# Patient Record
Sex: Female | Born: 1980 | Race: White | Hispanic: No | Marital: Married | Smoking: Former smoker
Health system: Southern US, Community
[De-identification: ages and names within clinical notes are randomized; demographics above are authoritative.]

## PROBLEM LIST (undated history)

## (undated) DIAGNOSIS — R519 Headache, unspecified: Secondary | ICD-10-CM

## (undated) DIAGNOSIS — I514 Myocarditis, unspecified: Secondary | ICD-10-CM

## (undated) DIAGNOSIS — J439 Emphysema, unspecified: Secondary | ICD-10-CM

## (undated) DIAGNOSIS — M545 Low back pain: Secondary | ICD-10-CM

## (undated) DIAGNOSIS — G5793 Unspecified mononeuropathy of bilateral lower limbs: Secondary | ICD-10-CM

## (undated) DIAGNOSIS — D649 Anemia, unspecified: Secondary | ICD-10-CM

## (undated) DIAGNOSIS — R51 Headache: Secondary | ICD-10-CM

## (undated) DIAGNOSIS — M3500 Sicca syndrome, unspecified: Secondary | ICD-10-CM

## (undated) DIAGNOSIS — M1990 Unspecified osteoarthritis, unspecified site: Secondary | ICD-10-CM

## (undated) HISTORY — PX: ABDOMINAL HYSTERECTOMY: SHX81

## (undated) HISTORY — PX: TONSILLECTOMY: SUR1361

---

## 2004-06-16 ENCOUNTER — Emergency Department: Payer: Self-pay | Admitting: Emergency Medicine

## 2006-04-23 ENCOUNTER — Emergency Department: Payer: Self-pay | Admitting: Emergency Medicine

## 2006-08-19 ENCOUNTER — Emergency Department: Payer: Self-pay | Admitting: Emergency Medicine

## 2006-12-29 ENCOUNTER — Observation Stay: Payer: Self-pay

## 2007-01-05 ENCOUNTER — Observation Stay: Payer: Self-pay | Admitting: Unknown Physician Specialty

## 2007-01-06 ENCOUNTER — Encounter: Payer: Self-pay | Admitting: Obstetrics and Gynecology

## 2007-01-20 ENCOUNTER — Encounter: Payer: Self-pay | Admitting: Maternal & Fetal Medicine

## 2007-01-28 ENCOUNTER — Observation Stay: Payer: Self-pay | Admitting: Unknown Physician Specialty

## 2007-02-11 ENCOUNTER — Ambulatory Visit: Payer: Self-pay

## 2007-03-07 ENCOUNTER — Observation Stay: Payer: Self-pay

## 2007-03-09 ENCOUNTER — Observation Stay: Payer: Self-pay | Admitting: Unknown Physician Specialty

## 2007-03-12 ENCOUNTER — Observation Stay: Payer: Self-pay

## 2007-04-02 ENCOUNTER — Ambulatory Visit: Payer: Self-pay | Admitting: Obstetrics & Gynecology

## 2007-04-03 ENCOUNTER — Inpatient Hospital Stay: Payer: Self-pay | Admitting: Obstetrics & Gynecology

## 2008-04-18 ENCOUNTER — Emergency Department: Payer: Self-pay | Admitting: Emergency Medicine

## 2008-10-27 ENCOUNTER — Ambulatory Visit: Payer: Self-pay | Admitting: Family Medicine

## 2010-03-27 ENCOUNTER — Emergency Department: Payer: Self-pay | Admitting: Emergency Medicine

## 2010-04-03 ENCOUNTER — Emergency Department: Payer: Self-pay | Admitting: Emergency Medicine

## 2010-12-25 ENCOUNTER — Ambulatory Visit: Payer: Self-pay | Admitting: Family Medicine

## 2011-12-13 ENCOUNTER — Emergency Department: Payer: Self-pay | Admitting: Emergency Medicine

## 2013-01-02 ENCOUNTER — Emergency Department: Payer: Self-pay | Admitting: Emergency Medicine

## 2013-01-02 LAB — URINALYSIS, COMPLETE
RBC,UR: 23 /HPF (ref 0–5)
WBC UR: 456 /HPF (ref 0–5)

## 2013-01-02 LAB — COMPREHENSIVE METABOLIC PANEL
Albumin: 3.9 g/dL (ref 3.4–5.0)
BUN: 4 mg/dL — ABNORMAL LOW (ref 7–18)
Bilirubin,Total: 0.6 mg/dL (ref 0.2–1.0)
Calcium, Total: 8.8 mg/dL (ref 8.5–10.1)
Co2: 26 mmol/L (ref 21–32)
Creatinine: 0.66 mg/dL (ref 0.60–1.30)
EGFR (Non-African Amer.): >60
Glucose: 81 mg/dL (ref 65–99)
Osmolality: 273 (ref 275–301)
SGOT(AST): 17 U/L (ref 15–37)
SGPT (ALT): 15 U/L (ref 12–78)
Sodium: 139 mmol/L (ref 136–145)

## 2013-01-02 LAB — CBC
HGB: 14.4 g/dL (ref 12.0–16.0)
MCH: 32.2 pg (ref 26.0–34.0)
MCV: 92 fL (ref 80–100)
RDW: 12.2 % (ref 11.5–14.5)
WBC: 12.1 10*3/uL — ABNORMAL HIGH (ref 3.6–11.0)

## 2013-01-02 LAB — WET PREP, GENITAL

## 2013-01-02 LAB — GC/CHLAMYDIA PROBE AMP

## 2013-01-03 LAB — URINE CULTURE

## 2013-04-13 ENCOUNTER — Emergency Department: Payer: Self-pay | Admitting: Emergency Medicine

## 2013-04-13 LAB — COMPREHENSIVE METABOLIC PANEL
Albumin: 4 g/dL (ref 3.4–5.0)
Alkaline Phosphatase: 71 U/L (ref 50–136)
BUN: 5 mg/dL — ABNORMAL LOW (ref 7–18)
Bilirubin,Total: 0.7 mg/dL (ref 0.2–1.0)
Calcium, Total: 8.8 mg/dL (ref 8.5–10.1)
Creatinine: 1.03 mg/dL (ref 0.60–1.30)
EGFR (African American): >60
EGFR (Non-African Amer.): >60
Glucose: 94 mg/dL (ref 65–99)
Osmolality: 273 (ref 275–301)
Potassium: 3.2 mmol/L — ABNORMAL LOW (ref 3.5–5.1)
SGPT (ALT): 13 U/L (ref 12–78)
Sodium: 138 mmol/L (ref 136–145)
Total Protein: 7.3 g/dL (ref 6.4–8.2)

## 2013-04-13 LAB — CBC
MCHC: 34.9 g/dL (ref 32.0–36.0)
Platelet: 167 10*3/uL (ref 150–440)
RBC: 4.73 10*6/uL (ref 3.80–5.20)
RDW: 12.7 % (ref 11.5–14.5)

## 2013-04-13 LAB — CK TOTAL AND CKMB (NOT AT ARMC): CK, Total: 44 U/L (ref 21–215)

## 2013-06-14 ENCOUNTER — Emergency Department: Payer: Self-pay | Admitting: Emergency Medicine

## 2013-06-28 ENCOUNTER — Ambulatory Visit: Payer: Self-pay | Admitting: Family Medicine

## 2013-06-28 LAB — URINALYSIS, COMPLETE
Ketone: NEGATIVE
Ph: 6 (ref 4.5–8.0)
RBC,UR: >30 /HPF (ref 0–5)
Specific Gravity: >=1.03 (ref 1.003–1.030)

## 2013-06-28 LAB — PREGNANCY, URINE: Pregnancy Test, Urine: NEGATIVE m[IU]/mL

## 2013-06-30 LAB — URINE CULTURE

## 2014-11-16 ENCOUNTER — Ambulatory Visit
Admit: 2014-11-16 | Disposition: A | Discharge status: Home / Self Care | Payer: Self-pay | Attending: Family Medicine | Admitting: Family Medicine

## 2014-11-24 ENCOUNTER — Ambulatory Visit
Admit: 2014-11-24 | Disposition: A | Discharge status: Home / Self Care | Payer: Self-pay | Attending: Otolaryngology | Admitting: Otolaryngology

## 2014-11-24 HISTORY — PX: TONSILLECTOMY: SUR1361

## 2014-11-29 LAB — SURGICAL PATHOLOGY

## 2016-01-20 ENCOUNTER — Ambulatory Visit (INDEPENDENT_AMBULATORY_CARE_PROVIDER_SITE_OTHER): Payer: Self-pay

## 2016-01-20 ENCOUNTER — Ambulatory Visit
Admission: EM | Admit: 2016-01-20 | Discharge: 2016-01-20 | Disposition: A | Discharge status: Home / Self Care | Payer: Self-pay | Attending: Family Medicine | Admitting: Family Medicine

## 2016-01-20 DIAGNOSIS — J40 Bronchitis, not specified as acute or chronic: Secondary | ICD-10-CM

## 2016-01-20 DIAGNOSIS — M940 Chondrocostal junction syndrome [Tietze]: Secondary | ICD-10-CM

## 2016-01-20 HISTORY — DX: Myocarditis, unspecified: I51.4

## 2016-01-20 MED ORDER — IPRATROPIUM-ALBUTEROL 0.5-2.5 (3) MG/3ML IN SOLN
3.0000 mL | Freq: Four times a day (QID) | RESPIRATORY_TRACT | Status: DC
  Administered 2016-01-20: 3 mL via RESPIRATORY_TRACT

## 2016-01-20 MED ORDER — BENZONATATE 100 MG PO CAPS: 100.0000 mg | ORAL_CAPSULE | Freq: Three times a day (TID) | ORAL | Status: DC | PRN

## 2016-01-20 MED ORDER — HYDROCOD POLST-CPM POLST ER 10-8 MG/5ML PO SUER: 5.0000 mL | Freq: Every evening | ORAL | Status: DC | PRN

## 2016-01-20 MED ORDER — PREDNISONE 10 MG PO TABS: ORAL_TABLET | ORAL | Status: DC

## 2016-01-20 MED ORDER — ALBUTEROL SULFATE HFA 108 (90 BASE) MCG/ACT IN AERS: 2.0000 | INHALATION_SPRAY | RESPIRATORY_TRACT | Status: DC | PRN

## 2016-01-20 MED ORDER — AZITHROMYCIN 250 MG PO TABS: ORAL_TABLET | ORAL | Status: DC

## 2016-01-20 NOTE — ED Provider Notes (Signed)
[facility]  ____________________________________________  Time seen: Approximately 4:26 PM  I have reviewed the triage vital signs and the nursing notes.   HISTORY  Chief Complaint Cough  HPI Tanya Crawford is a 35 y.o. female  presents with complaints of 1.5 weeks of cough and chest congestion. Patient reports some runny nose but primarily chest congestion. Patient reports she felt like she has coughed "so much that I should have ripped abs by now". Patient reports she does have some intermittent soreness to her chest and her sides and states that that was a gradual onset after coughing frequently and states that the area is reproduced with cough as well as direct palpation. Reports cough is occasionally productive but states most often it is a dry hacking cough. States cough does increase at night and unsure if she's having postnasal drainage. Patient reports that initial onset of her symptoms for the first 2-3 days she felt like she had a fever. Denies fever since. Reports continues to drink fluids well with slight decrease in appetite.  Denies chest pain with deep breath. Denies shortness of breath. Denies recent sickness. Reports her husband at home has recently started with similar symptoms. Patient states that her symptom onset or while she was at the beach. Denies headache, blood in sputum or mucous, abnormal bleeding, dizziness, neck pain, back pain, extremity swelling or extremity pain, dysuria, abdominal pain or recent antibiotic use.Trip to beach was to [facility]. Denies leg pain.  Denies history of DVT or PE.  PCP: Clemmie Krill  No LMP recorded. Patient has had a hysterectomy.    Past Medical History  Diagnosis Date  . 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   Past Surgical History  Procedure Laterality Date  . Cesarean section    . Tonsillectomy    . Abdominal hysterectomy      Current Outpatient Rx  Name   Route  Sig  Dispense  Refill  . diazepam (VALIUM) 10 MG tablet   Oral   Take 10 mg by mouth 3 (three) times daily.         Marland Kitchen zolpidem (AMBIEN) 10 MG tablet   Oral   Take 10 mg by mouth every 4 (four) hours as needed for sleep.           Allergies Review of patient's allergies indicates no known allergies.  Family History  Problem Relation Age of Onset  . Cancer Mother   . Cancer Father   . Cancer Other     Social History Social History  Substance Use Topics  . Smoking status: Current Every Day Smoker -- 0.50 packs/day    Types: Cigarettes  . Smokeless tobacco: Never Used  . Alcohol Use: No    Review of Systems Constitutional: As above.  Eyes: No visual changes. ENT:As above.  Cardiovascular: Denies chest pain. Respiratory: Denies shortness of breath. Gastrointestinal: No abdominal pain.  No nausea, no vomiting.  No diarrhea.  No constipation. Genitourinary: Negative for dysuria. Musculoskeletal: Negative for back pain. Skin: Negative for rash. Neurological: Negative for headaches, focal weakness or numbness.  10-point ROS otherwise negative.  ____________________________________________   PHYSICAL EXAM:  VITAL SIGNS: ED Triage Vitals  Enc Vitals Group     BP 01/20/16 1615 109/73 mmHg     Pulse Rate 01/20/16 1615 81     Resp 01/20/16 1615 16     Temp 01/20/16 1615 98.4 F (36.9 C)  Temp Source 01/20/16 1615 Oral     SpO2 01/20/16 1615 100 %     Weight 01/20/16 1615 135 lb (61.236 kg)     Height 01/20/16 1615 5\' 5"  (1.651 m)     Head Cir --      Peak Flow --      Pain Score 01/20/16 1617 6     Pain Loc --      Pain Edu? --      Excl. in [place]? --    Constitutional: Alert and oriented. Well appearing and in no acute distress. Eyes: Conjunctivae are normal. PERRL. EOMI. Head: Atraumatic. No sinus tenderness to palpation. No swelling. No erythema.  Ears: no erythema, normal TMs bilaterally.   Nose: Mild clear rhinorrhea.  Mouth/Throat: Mucous  membranes are moist. No pharyngeal erythema. No tonsillar swelling or exudate.  Neck: No stridor.  No cervical spine tenderness to palpation. Hematological/Lymphatic/Immunilogical: No cervical lymphadenopathy. Cardiovascular: Normal rate, regular rhythm. Grossly normal heart sounds.  Good peripheral circulation. Respiratory: Normal respiratory effort.  No retractions. Dry intermittent cough noted with mild bronchospasm and wheeze. Mild scattered rhonchi bilateral bases. Good air movement. Speaks in complete sentences. Left and right anterior chest and coming laterally around bilaterally to mild tenderness to ribs, per patient pain fully reproducible by palpation, no ecchymosis, no crepitus, no palpable rib fracture, no erythema. Gastrointestinal: Soft and nontender. Normal Bowel sounds. No CVA tenderness. Musculoskeletal: No lower or upper extremity tenderness nor edema. No cervical, thoracic or lumbar tenderness to palpation. Bilateral pedal pulses equal and easily palpable. No calf tenderness bilaterally. Neurologic:  Normal speech and language. No gross focal neurologic deficits are appreciated. No gait instability. Skin:  Skin is warm, dry and intact. No rash noted. Psychiatric: Mood and affect are normal. Speech and behavior are normal.  PERC negative. Wells score 0.  ____________________________________________   LABS (all labs ordered are listed, but only abnormal results are displayed)  Labs Reviewed - No data to display ____________________________________________  RADIOLOGY  Dg Chest 2 View  01/20/2016  CLINICAL DATA:  Cough and wheezing for 10 days EXAM: CHEST  2 VIEW COMPARISON:  April 13, 2013 FINDINGS: Lungs are clear. Heart size and pulmonary vascularity are normal. No adenopathy. No bone lesions. IMPRESSION: No edema or consolidation. Electronically Signed   By: Lowella Grip III M.D.   On: 01/20/2016 16:55     INITIAL IMPRESSION / ASSESSMENT AND PLAN / ED  COURSE  Pertinent labs & imaging results that were available during my care of the patient were reviewed by me and considered in my medical decision making (see chart for details).  Overall well-appearing patient. No acute distress. Presents with months of 1.5 weeks of cough and chest congestion. Patient reports cough is her biggest complaint. Continues to drink fluids well for decrease in appetite. Dry intermittent cough noted in room with mild bronchospasm. Suspect bronchitis. Discussed with patient also suspect gastroenteritis secondary to coughing as patient reports bilateral tenderness that is to chest wall that is fully reproducible by direct palpation. Vital signs stable, non-tachycardic, pulse ox 100%. Will evaluate chest x-ray and albuterol ipratropium neb 1.   Chest x-ray reviewed, per radiologist no edema or consolidation. Post albuterol neb patient reports feeling better but somewhat jittery from the albuterol, lungs reauscultated and no wheezes, rales or rhonchi at this time. Patient denies chest pain, shortness of breath or chest pain with deep breaths. Suspect bronchitis and costochondritis. Will treat patient with oral azithromycin, when necessary Tessalon Perles during the day  and prn Tussionex at night. Albuterol inhaler when necessary and prednisone taper. Discussed indication, risks and benefits of medications with patient. Encouraged close PCP follow-up and discussed in detail with patient to follow up and seek immediate treatment including chest pain, shortness of breath, chest with deep breath, unilateral leg swelling, blood in sputum, fevers, worsening concerns. Patient verbalized understanding and agree to this plan.  Discussed follow up with Primary care physician this week. Discussed follow up and return parameters including no resolution or any worsening concerns. Patient verbalized understanding and agreed to plan.   ____________________________________________   FINAL  CLINICAL IMPRESSION(S) / ED DIAGNOSES  Final diagnoses:  Bronchitis  Costochondritis     Discharge Medication List as of 01/20/2016  5:24 PM    START taking these medications   Details  albuterol (PROVENTIL HFA;VENTOLIN HFA) 108 (90 Base) MCG/ACT inhaler Inhale 2 puffs into the lungs every 4 (four) hours as needed for wheezing., Starting 01/20/2016, Until Discontinued, Normal    azithromycin (ZITHROMAX Z-PAK) 250 MG tablet Take 2 tablets (500 mg) on  Day 1,  followed by 1 tablet (250 mg) once daily on Days 2 through 5., Normal    benzonatate (TESSALON PERLES) 100 MG capsule Take 1 capsule (100 mg total) by mouth 3 (three) times daily as needed for cough., Starting 01/20/2016, Until Discontinued, Normal    chlorpheniramine-HYDROcodone (TUSSIONEX PENNKINETIC ER) 10-8 MG/5ML SUER Take 5 mLs by mouth at bedtime as needed for cough (do not drive or operate machinery while taking as can cause drowsiness.). do not drive or operate machinery while taking as can cause drowsiness., Starting 01/20/2016, Until Discontinued, Print    predniSONE (DELTASONE) 10 MG tablet Start 60 mg po day one, then 50 mg po day two, taper by 10 mg daily until complete., Normal        Note: This dictation was prepared with Dragon dictation along with smaller phrase technology. Any transcriptional errors that result from this process are unintentional.       Marylene Land, NP 01/20/16 1750

## 2016-01-20 NOTE — Discharge Instructions (Signed)
Take medication as prescribed. Rest. Drink plenty of fluids.   Follow up with your primary care physician this week.   Return to Urgent care or proceed directly to ER for chest pain, shortness of breath, dizziness, chest pain with deep breath, unilateral extremity swelling, fevers, new or worsening concerns.    Costochondritis Costochondritis, sometimes called Tietze syndrome, is a swelling and irritation (inflammation) of the tissue (cartilage) that connects your ribs with your breastbone (sternum). It causes pain in the chest and rib area. Costochondritis usually goes away on its own over time. It can take up to 6 weeks or longer to get better, especially if you are unable to limit your activities. CAUSES  Some cases of costochondritis have no known cause. Possible causes include: 1. Injury (trauma). 2. Exercise or activity such as lifting. 3. Severe coughing. SIGNS AND SYMPTOMS 1. Pain and tenderness in the chest and rib area. 2. Pain that gets worse when coughing or taking deep breaths. 3. Pain that gets worse with specific movements. DIAGNOSIS  Your health care provider will do a physical exam and ask about your symptoms. Chest X-rays or other tests may be done to rule out other problems. TREATMENT  Costochondritis usually goes away on its own over time. Your health care provider may prescribe medicine to help relieve pain. HOME CARE INSTRUCTIONS   Avoid exhausting physical activity. Try not to strain your ribs during normal activity. This would include any activities using chest, abdominal, and side muscles, especially if heavy weights are used.  Apply ice to the affected area for the first 2 days after the pain begins.  Put ice in a plastic bag.  Place a towel between your skin and the bag.  Leave the ice on for 20 minutes, 2-3 times a day.  Only take over-the-counter or prescription medicines as directed by your health care provider. SEEK MEDICAL CARE IF:  You have  redness or swelling at the rib joints. These are signs of infection.  Your pain does not go away despite rest or medicine. SEEK IMMEDIATE MEDICAL CARE IF:   Your pain increases or you are very uncomfortable.  You have shortness of breath or difficulty breathing.  You cough up blood.  You have worse chest pains, sweating, or vomiting.  You have a fever or persistent symptoms for more than 2-3 days.  You have a fever and your symptoms suddenly get worse. MAKE SURE YOU:   Understand these instructions.  Will watch your condition.  Will get help right away if you are not doing well or get worse.   This information is not intended to replace advice given to you by your health care provider. Make sure you discuss any questions you have with your health care provider.   Document Released: 05/02/2005 Document Revised: 05/13/2013 Document Reviewed: 02/24/2013 Elsevier Interactive Patient Education 2016 [employer].  How to Use an Inhaler Proper inhaler technique is very important. Good technique ensures that the medicine reaches the lungs. Poor technique results in depositing the medicine on the tongue and back of the throat rather than in the airways. If you do not use the inhaler with good technique, the medicine will not help you. STEPS TO FOLLOW IF USING AN INHALER WITHOUT AN EXTENSION TUBE 4. Remove the cap from the inhaler. 5. If you are using the inhaler for the first time, you will need to prime it. Shake the inhaler for 5 seconds and release four puffs into the air, away from your face. Ask  your health care provider or pharmacist if you have questions about priming your inhaler. 6. Shake the inhaler for 5 seconds before each breath in (inhalation). 7. Position the inhaler so that the top of the canister faces up. 8. Put your index finger on the top of the medicine canister. Your thumb supports the bottom of the inhaler. 9. Open your mouth. 10. Either place the inhaler between  your teeth and place your lips tightly around the mouthpiece, or hold the inhaler 1-2 inches away from your open mouth. If you are unsure of which technique to use, ask your health care provider. 11. Breathe out (exhale) normally and as completely as possible. 12. Press the canister down with your index finger to release the medicine. 13. At the same time as the canister is pressed, inhale deeply and slowly until your lungs are completely filled. This should take 4-6 seconds. Keep your tongue down. 14. Hold the medicine in your lungs for 5-10 seconds (10 seconds is best). This helps the medicine get into the small airways of your lungs. 15. Breathe out slowly, through pursed lips. Whistling is an example of pursed lips. 16. Wait at least 15-30 seconds between puffs. Continue with the above steps until you have taken the number of puffs your health care provider has ordered. Do not use the inhaler more than your health care provider tells you. 17. Replace the cap on the inhaler. 18. Follow the directions from your health care provider or the inhaler insert for cleaning the inhaler. STEPS TO FOLLOW IF USING AN INHALER WITH AN EXTENSION (SPACER) 4. Remove the cap from the inhaler. 5. If you are using the inhaler for the first time, you will need to prime it. Shake the inhaler for 5 seconds and release four puffs into the air, away from your face. Ask your health care provider or pharmacist if you have questions about priming your inhaler. 6. Shake the inhaler for 5 seconds before each breath in (inhalation). 7. Place the open end of the spacer onto the mouthpiece of the inhaler. 8. Position the inhaler so that the top of the canister faces up and the spacer mouthpiece faces you. 9. Put your index finger on the top of the medicine canister. Your thumb supports the bottom of the inhaler and the spacer. 10. Breathe out (exhale) normally and as completely as possible. 11. Immediately after exhaling, place  the spacer between your teeth and into your mouth. Close your lips tightly around the spacer. 12. Press the canister down with your index finger to release the medicine. 13. At the same time as the canister is pressed, inhale deeply and slowly until your lungs are completely filled. This should take 4-6 seconds. Keep your tongue down and out of the way. 14. Hold the medicine in your lungs for 5-10 seconds (10 seconds is best). This helps the medicine get into the small airways of your lungs. Exhale. 15. Repeat inhaling deeply through the spacer mouthpiece. Again hold that breath for up to 10 seconds (10 seconds is best). Exhale slowly. If it is difficult to take this second deep breath through the spacer, breathe normally several times through the spacer. Remove the spacer from your mouth. 16. Wait at least 15-30 seconds between puffs. Continue with the above steps until you have taken the number of puffs your health care provider has ordered. Do not use the inhaler more than your health care provider tells you. 17. Remove the spacer from the inhaler, and place  the cap on the inhaler. 18. Follow the directions from your health care provider or the inhaler insert for cleaning the inhaler and spacer. If you are using different kinds of inhalers, use your quick relief medicine to open the airways 10-15 minutes before using a steroid if instructed to do so by your health care provider. If you are unsure which inhalers to use and the order of using them, ask your health care provider, nurse, or respiratory therapist. If you are using a steroid inhaler, always rinse your mouth with water after your last puff, then gargle and spit out the water. Do not swallow the water. AVOID:  Inhaling before or after starting the spray of medicine. It takes practice to coordinate your breathing with triggering the spray.  Inhaling through the nose (rather than the mouth) when triggering the spray. HOW TO DETERMINE IF YOUR  INHALER IS FULL OR NEARLY EMPTY You cannot know when an inhaler is empty by shaking it. A few inhalers are now being made with dose counters. Ask your health care provider for a prescription that has a dose counter if you feel you need that extra help. If your inhaler does not have a counter, ask your health care provider to help you determine the date you need to refill your inhaler. Write the refill date on a calendar or your inhaler canister. Refill your inhaler 7-10 days before it runs out. Be sure to keep an adequate supply of medicine. This includes making sure it is not expired, and that you have a spare inhaler.  SEEK MEDICAL CARE IF:   Your symptoms are only partially relieved with your inhaler.  You are having trouble using your inhaler.  You have some increase in phlegm. SEEK IMMEDIATE MEDICAL CARE IF:   You feel little or no relief with your inhalers. You are still wheezing and are feeling shortness of breath or tightness in your chest or both.  You have dizziness, headaches, or a fast heart rate.  You have chills, fever, or night sweats.  You have a noticeable increase in phlegm production, or there is blood in the phlegm. MAKE SURE YOU:   Understand these instructions.  Will watch your condition.  Will get help right away if you are not doing well or get worse.   This information is not intended to replace advice given to you by your health care provider. Make sure you discuss any questions you have with your health care provider.   Document Released: 07/20/2000 Document Revised: 05/13/2013 Document Reviewed: 02/19/2013 Elsevier Interactive Patient Education [employer].

## 2016-01-20 NOTE — ED Notes (Signed)
Patient complains of non productive cough that is making her chest tight and hurt. Symptoms started on January 07, 2016. She tried over the counter meds with no relief.

## 2016-02-28 ENCOUNTER — Ambulatory Visit
Admission: RE | Admit: 2016-02-28 | Discharge: 2016-02-28 | Disposition: A | Discharge status: Home / Self Care | Payer: BLUE CROSS/BLUE SHIELD | Source: Ambulatory Visit | Attending: Family Medicine | Admitting: Family Medicine

## 2016-02-28 ENCOUNTER — Other Ambulatory Visit: Payer: Self-pay | Admitting: Family Medicine

## 2016-02-28 DIAGNOSIS — R140 Abdominal distension (gaseous): Secondary | ICD-10-CM

## 2016-03-21 ENCOUNTER — Other Ambulatory Visit: Payer: Self-pay | Admitting: Family Medicine

## 2016-03-21 DIAGNOSIS — IMO0001 Reserved for inherently not codable concepts without codable children: Secondary | ICD-10-CM

## 2016-03-27 ENCOUNTER — Ambulatory Visit
Admission: RE | Admit: 2016-03-27 | Discharge: 2016-03-27 | Disposition: A | Discharge status: Home / Self Care | Payer: BLUE CROSS/BLUE SHIELD | Source: Ambulatory Visit | Attending: Family Medicine | Admitting: Family Medicine

## 2016-03-27 DIAGNOSIS — K824 Cholesterolosis of gallbladder: Secondary | ICD-10-CM | POA: Insufficient documentation

## 2016-03-27 DIAGNOSIS — R140 Abdominal distension (gaseous): Secondary | ICD-10-CM | POA: Insufficient documentation

## 2016-03-27 DIAGNOSIS — IMO0001 Reserved for inherently not codable concepts without codable children: Secondary | ICD-10-CM

## 2016-04-10 ENCOUNTER — Other Ambulatory Visit: Payer: Self-pay | Admitting: Gastroenterology

## 2016-04-10 DIAGNOSIS — R1011 Right upper quadrant pain: Secondary | ICD-10-CM

## 2016-04-17 ENCOUNTER — Ambulatory Visit
Admission: RE | Admit: 2016-04-17 | Discharge: 2016-04-17 | Disposition: A | Discharge status: Home / Self Care | Payer: BLUE CROSS/BLUE SHIELD | Source: Ambulatory Visit | Attending: Gastroenterology | Admitting: Gastroenterology

## 2016-04-17 DIAGNOSIS — R1011 Right upper quadrant pain: Secondary | ICD-10-CM | POA: Diagnosis not present

## 2016-04-17 MED ORDER — TECHNETIUM TC 99M MEBROFENIN IV KIT
4.8600 | PACK | Freq: Once | INTRAVENOUS | Status: AC | PRN
  Administered 2016-04-17: 4.86 via INTRAVENOUS

## 2016-05-01 ENCOUNTER — Other Ambulatory Visit: Payer: Self-pay | Admitting: Neurology

## 2016-05-01 DIAGNOSIS — R5383 Other fatigue: Secondary | ICD-10-CM

## 2016-05-01 DIAGNOSIS — R202 Paresthesia of skin: Principal | ICD-10-CM

## 2016-05-01 DIAGNOSIS — R5381 Other malaise: Secondary | ICD-10-CM

## 2016-05-01 DIAGNOSIS — H539 Unspecified visual disturbance: Secondary | ICD-10-CM

## 2016-05-01 DIAGNOSIS — R200 Anesthesia of skin: Secondary | ICD-10-CM

## 2016-05-09 ENCOUNTER — Ambulatory Visit
Admission: RE | Admit: 2016-05-09 | Discharge: 2016-05-09 | Disposition: A | Discharge status: Home / Self Care | Payer: BLUE CROSS/BLUE SHIELD | Source: Ambulatory Visit | Attending: Neurology | Admitting: Neurology

## 2016-05-09 DIAGNOSIS — R200 Anesthesia of skin: Secondary | ICD-10-CM

## 2016-05-09 DIAGNOSIS — M5137 Other intervertebral disc degeneration, lumbosacral region: Secondary | ICD-10-CM | POA: Insufficient documentation

## 2016-05-09 DIAGNOSIS — R202 Paresthesia of skin: Secondary | ICD-10-CM | POA: Insufficient documentation

## 2016-05-09 DIAGNOSIS — R5381 Other malaise: Secondary | ICD-10-CM

## 2016-05-09 DIAGNOSIS — R5383 Other fatigue: Secondary | ICD-10-CM

## 2016-05-09 DIAGNOSIS — H539 Unspecified visual disturbance: Secondary | ICD-10-CM

## 2016-05-24 ENCOUNTER — Emergency Department
Admission: EM | Admit: 2016-05-24 | Discharge: 2016-05-25 | Disposition: A | Discharge status: Home / Self Care | Payer: BLUE CROSS/BLUE SHIELD | Attending: Emergency Medicine | Admitting: Emergency Medicine

## 2016-05-24 ENCOUNTER — Encounter: Payer: Self-pay | Admitting: *Deleted

## 2016-05-24 DIAGNOSIS — F17210 Nicotine dependence, cigarettes, uncomplicated: Secondary | ICD-10-CM | POA: Diagnosis not present

## 2016-05-24 DIAGNOSIS — Z79899 Other long term (current) drug therapy: Secondary | ICD-10-CM | POA: Diagnosis not present

## 2016-05-24 DIAGNOSIS — R1012 Left upper quadrant pain: Secondary | ICD-10-CM | POA: Diagnosis not present

## 2016-05-24 LAB — COMPREHENSIVE METABOLIC PANEL
ALT: 10 U/L — AB (ref 14–54)
AST: 17 U/L (ref 15–41)
Albumin: 4.3 g/dL (ref 3.5–5.0)
Alkaline Phosphatase: 61 U/L (ref 38–126)
Anion gap: 7 (ref 5–15)
BUN: 9 mg/dL (ref 6–20)
CHLORIDE: 106 mmol/L (ref 101–111)
CO2: 25 mmol/L (ref 22–32)
CREATININE: 0.82 mg/dL (ref 0.44–1.00)
Calcium: 9 mg/dL (ref 8.9–10.3)
Glucose, Bld: 99 mg/dL (ref 65–99)
POTASSIUM: 3.5 mmol/L (ref 3.5–5.1)
SODIUM: 138 mmol/L (ref 135–145)
Total Bilirubin: 0.5 mg/dL (ref 0.3–1.2)
Total Protein: 7.3 g/dL (ref 6.5–8.1)

## 2016-05-24 LAB — CBC
HEMATOCRIT: 42.7 % (ref 35.0–47.0)
Hemoglobin: 14.9 g/dL (ref 12.0–16.0)
MCH: 31.8 pg (ref 26.0–34.0)
MCHC: 35 g/dL (ref 32.0–36.0)
MCV: 90.9 fL (ref 80.0–100.0)
Platelets: 156 10*3/uL (ref 150–440)
RBC: 4.7 MIL/uL (ref 3.80–5.20)
RDW: 11.7 % (ref 11.5–14.5)
WBC: 12.2 10*3/uL — AB (ref 3.6–11.0)

## 2016-05-24 LAB — URINALYSIS COMPLETE WITH MICROSCOPIC (ARMC ONLY)
Bilirubin Urine: NEGATIVE
Glucose, UA: NEGATIVE mg/dL
Hgb urine dipstick: NEGATIVE
KETONES UR: NEGATIVE mg/dL
LEUKOCYTES UA: NEGATIVE
Nitrite: NEGATIVE
PH: 7 (ref 5.0–8.0)
PROTEIN: NEGATIVE mg/dL
Specific Gravity, Urine: 1.005 (ref 1.005–1.030)

## 2016-05-24 LAB — LIPASE, BLOOD: LIPASE: 22 U/L (ref 11–51)

## 2016-05-24 NOTE — ED Notes (Signed)
Dr. Brown at bedside

## 2016-05-24 NOTE — ED Notes (Signed)
Pt currently being seen by neuro, rheum, and gi with multiple biopsies to try and find out why she is having chronic pains in her ribs. This is different in that its is the left rather than right side.

## 2016-05-24 NOTE — ED Triage Notes (Signed)
Pt states she is having pain and pressure in the left upper abdomen for about 2 days. Pt says had a biopsy in the area from a mass under the left on Monday, endoscopy/colonoscopy on Tuesday- pt being evaluated for Sjogrens disease.

## 2016-05-25 ENCOUNTER — Emergency Department: Payer: BLUE CROSS/BLUE SHIELD

## 2016-05-25 MED ORDER — ONDANSETRON HCL 4 MG/2ML IJ SOLN
INTRAMUSCULAR | Status: AC
  Administered 2016-05-25: 4 mg via INTRAVENOUS
  Filled 2016-05-25: qty 2

## 2016-05-25 MED ORDER — IOPAMIDOL (ISOVUE-300) INJECTION 61%
100.0000 mL | Freq: Once | INTRAVENOUS | Status: AC | PRN
  Administered 2016-05-25: 100 mL via INTRAVENOUS

## 2016-05-25 MED ORDER — KETOROLAC TROMETHAMINE 30 MG/ML IJ SOLN
INTRAMUSCULAR | Status: AC
  Administered 2016-05-25: 30 mg via INTRAVENOUS
  Filled 2016-05-25: qty 1

## 2016-05-25 MED ORDER — ONDANSETRON HCL 4 MG/2ML IJ SOLN
4.0000 mg | Freq: Once | INTRAMUSCULAR | Status: AC
  Administered 2016-05-25: 4 mg via INTRAVENOUS

## 2016-05-25 MED ORDER — KETOROLAC TROMETHAMINE 30 MG/ML IJ SOLN
30.0000 mg | Freq: Once | INTRAMUSCULAR | Status: AC
  Administered 2016-05-25: 30 mg via INTRAVENOUS

## 2016-05-25 MED ORDER — IOPAMIDOL (ISOVUE-300) INJECTION 61%
30.0000 mL | Freq: Once | INTRAVENOUS | Status: AC | PRN
  Administered 2016-05-25: 30 mL via ORAL

## 2016-05-25 NOTE — ED Notes (Signed)
Pt discharged to home.  Discharge instructions reviewed.  Verbalized understanding.  No questions or concerns at this time.  Teach back verified.  Pt in NAD.  No items left in ED.   

## 2016-05-25 NOTE — ED Provider Notes (Signed)
[facility] Emergency Department Provider Note    First MD Initiated Contact with Patient 05/25/16 2345     (approximate)  I have reviewed the triage vital signs and the nursing notes.   HISTORY  Chief Complaint Abdominal Pain    HPI Tanya Crawford is a 35 y.o. female presents with persistent left upper abdominal pain 2 days. Of note patient had a colonoscopy and endoscopy performed on Tuesday. Patient denies any fever no nausea or vomiting or diarrhea. Patient denies any constipation. Patient denies any chest pain no dyspnea   Past Medical History:  Diagnosis Date  . 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   Past Surgical History:  Procedure Laterality Date  . ABDOMINAL HYSTERECTOMY    . CESAREAN SECTION    . TONSILLECTOMY      Prior to Admission medications   Medication Sig Start Date End Date Taking? Authorizing Provider  diazepam (VALIUM) 10 MG tablet Take 10 mg by mouth 3 (three) times daily.   Yes Historical Provider, MD  zolpidem (AMBIEN) 10 MG tablet Take 10 mg by mouth at bedtime as needed for sleep.    Yes Historical Provider, MD    Allergies No known drug allergies  Family History  Problem Relation Age of Onset  . Cancer Mother   . Cancer Father   . Cancer Other     Social History Social History  Substance Use Topics  . Smoking status: Current Every Day Smoker    Packs/day: 0.50    Types: Cigarettes  . Smokeless tobacco: Never Used  . Alcohol use No    Review of Systems Constitutional: No fever/chills Eyes: No visual changes. ENT: No sore throat. Cardiovascular: Denies chest pain. Respiratory: Denies shortness of breath. Gastrointestinal:Positive abdominal pain.  No nausea, no vomiting.  No diarrhea.  No constipation. Genitourinary: Negative for dysuria. Musculoskeletal: Negative for back pain. Skin: Negative for rash. Neurological: Negative for headaches, focal  weakness or numbness.  10-point ROS otherwise negative.  ____________________________________________   PHYSICAL EXAM:  VITAL SIGNS: ED Triage Vitals  Enc Vitals Group     BP 05/24/16 2200 108/86     Pulse Rate 05/24/16 2200 85     Resp 05/24/16 2200 18     Temp 05/24/16 2200 97.7 F (36.5 C)     Temp Source 05/24/16 2200 Oral     SpO2 05/24/16 2200 99 %     Weight 05/24/16 2200 131 lb (59.4 kg)     Height 05/24/16 2200 5\' 5"  (1.651 m)     Head Circumference --      Peak Flow --      Pain Score 05/24/16 2244 8     Pain Loc --      Pain Edu? --      Excl. in [place]? --     Constitutional: Alert and oriented. Well appearing and in no acute distress. Eyes: Conjunctivae are normal. PERRL. EOMI. Head: Atraumatic. Ears:  Healthy appearing ear canals and TMs bilaterally Nose: No congestion/rhinnorhea. Mouth/Throat: Mucous membranes are moist.  Oropharynx non-erythematous. Neck: No stridor.  No meningeal signs.   Cardiovascular: Normal rate, regular rhythm. Good peripheral circulation. Grossly normal heart sounds. Respiratory: Normal respiratory effort.  No retractions. Lungs CTAB. Gastrointestinal: Soft and nontender. No distention.  Musculoskeletal: No lower extremity tenderness nor edema. No gross deformities of extremities. Neurologic:  Normal speech and language. No gross focal neurologic deficits are appreciated.  Skin:  Skin is warm, dry and intact. No rash noted. Psychiatric: Mood and affect are normal. Speech and behavior are normal.  ____________________________________________   LABS (all labs ordered are listed, but only abnormal results are displayed)  Labs Reviewed  COMPREHENSIVE METABOLIC PANEL - Abnormal; Notable for the following:       Result Value   ALT 10 (*)    All other components within normal limits  CBC - Abnormal; Notable for the following:    WBC 12.2 (*)    All other components within normal limits  URINALYSIS COMPLETEWITH MICROSCOPIC ([facility]  ONLY) - Abnormal; Notable for the following:    Color, Urine STRAW (*)    APPearance CLEAR (*)    Bacteria, UA RARE (*)    Squamous Epithelial / LPF 0-5 (*)    All other components within normal limits  LIPASE, BLOOD   ____________________________________________  EKG  ED ECG REPORT I, Segundo N Chamaine Stankus, the attending physician, personally viewed and interpreted this ECG.   Date: 05/25/2016  EKG Time: 10:15 PM  Rate: 94  Rhythm: Normal sinus rhythm  Axis: Normal  Intervals: Normal  ST&T Change: None  ____________________________________________  RADIOLOGY I, Blakeslee N Cena Bruhn, personally viewed and evaluated these images (plain radiographs) as part of my medical decision making, as well as reviewing the written report by the radiologist.  Ct Abdomen Pelvis W Contrast  Result Date: 05/25/2016 CLINICAL DATA:  Acute onset of left upper abdominal pain and pressure. Initial encounter. EXAM: CT ABDOMEN AND PELVIS WITH CONTRAST TECHNIQUE: Multidetector CT imaging of the abdomen and pelvis was performed using the standard protocol following bolus administration of intravenous contrast. CONTRAST:  136mL ISOVUE-300 IOPAMIDOL (ISOVUE-300) INJECTION 61% COMPARISON:  MRI of the lumbar spine performed 05/09/2016 FINDINGS: Lower chest: Minimal right basilar atelectasis is noted. The visualized portions of the mediastinum are unremarkable. Hepatobiliary: Small hypodensities within the liver measure up to 4 mm in size. These are nonspecific. The gallbladder is grossly unremarkable in appearance. The common bile duct remains normal in caliber. Pancreas: The pancreas is within normal limits. Spleen: The spleen is unremarkable in appearance. Adrenals/Urinary Tract: The adrenal glands are unremarkable in appearance. Scarring is noted at the right kidney. There is no evidence of hydronephrosis. No renal or ureteral stones are identified. No perinephric stranding is seen. Stomach/Bowel: The stomach is  unremarkable in appearance. The small bowel is within normal limits. The appendix is normal in caliber, without evidence of appendicitis. The colon is unremarkable in appearance. Vascular/Lymphatic: The abdominal aorta is unremarkable in appearance. The inferior vena cava is grossly unremarkable. No retroperitoneal lymphadenopathy is seen. No pelvic sidewall lymphadenopathy is identified. Reproductive: The bladder is decompressed and not well assessed. The patient is status post hysterectomy. No suspicious adnexal masses are seen. The ovaries are relatively symmetric. No suspicious adnexal masses are seen. Other: No additional soft tissue abnormalities are seen. Musculoskeletal: No acute osseous abnormalities are identified. The visualized musculature is unremarkable in appearance. IMPRESSION: 1. No acute abnormality seen within the abdomen or pelvis. 2. Scarring at the right kidney. 3. Small nonspecific hypodensities within the liver measure up to 4 mm in size. Electronically Signed   By: Garald Balding M.D.   On: 05/25/2016 02:12    Procedures      INITIAL IMPRESSION / ASSESSMENT AND PLAN / ED COURSE  Pertinent labs & imaging results that were available during my care of the patient were reviewed by me and considered in my medical decision making (see chart for details).  No clear etiology identified for the patient's abdominal pain. Laboratory data and CT scan revealed no gross abnormalities. Patient be referred to primary care provider for his outpatient evaluation and management.   Clinical Course    ____________________________________________  FINAL CLINICAL IMPRESSION(S) / ED DIAGNOSES  Final diagnoses:  Left upper quadrant pain     MEDICATIONS GIVEN DURING THIS VISIT:  Medications  ketorolac (TORADOL) 30 MG/ML injection 30 mg (30 mg Intravenous Given 05/25/16 0015)  ondansetron (ZOFRAN) injection 4 mg (4 mg Intravenous Given 05/25/16 0015)  iopamidol (ISOVUE-300) 61 %  injection 30 mL (30 mLs Oral Contrast Given 05/25/16 0029)  iopamidol (ISOVUE-300) 61 % injection 100 mL (100 mLs Intravenous Contrast Given 05/25/16 0126)     NEW OUTPATIENT MEDICATIONS STARTED DURING THIS VISIT:  New Prescriptions   No medications on file    Modified Medications   No medications on file    Discontinued Medications   ALBUTEROL (PROVENTIL HFA;VENTOLIN HFA) 108 (90 BASE) MCG/ACT INHALER    Inhale 2 puffs into the lungs every 4 (four) hours as needed for wheezing.   AZITHROMYCIN (ZITHROMAX Z-PAK) 250 MG TABLET    Take 2 tablets (500 mg) on  Day 1,  followed by 1 tablet (250 mg) once daily on Days 2 through 5.   BENZONATATE (TESSALON PERLES) 100 MG CAPSULE    Take 1 capsule (100 mg total) by mouth 3 (three) times daily as needed for cough.   CHLORPHENIRAMINE-HYDROCODONE (TUSSIONEX PENNKINETIC ER) 10-8 MG/5ML SUER    Take 5 mLs by mouth at bedtime as needed for cough (do not drive or operate machinery while taking as can cause drowsiness.). do not drive or operate machinery while taking as can cause drowsiness.   PREDNISONE (DELTASONE) 10 MG TABLET    Start 60 mg po day one, then 50 mg po day two, taper by 10 mg daily until complete.     Note:  This document was prepared using Dragon voice recognition software and may include unintentional dictation errors.    Gregor Hams, MD 05/25/16 [phone]

## 2016-06-05 ENCOUNTER — Other Ambulatory Visit: Payer: Self-pay | Admitting: Gastroenterology

## 2016-06-05 DIAGNOSIS — K769 Liver disease, unspecified: Secondary | ICD-10-CM

## 2016-06-13 ENCOUNTER — Other Ambulatory Visit: Payer: Self-pay | Admitting: Neurology

## 2016-06-13 DIAGNOSIS — M542 Cervicalgia: Secondary | ICD-10-CM

## 2016-06-13 DIAGNOSIS — R200 Anesthesia of skin: Secondary | ICD-10-CM

## 2016-07-04 ENCOUNTER — Ambulatory Visit: Payer: BLUE CROSS/BLUE SHIELD

## 2016-08-21 ENCOUNTER — Ambulatory Visit
Admission: RE | Admit: 2016-08-21 | Discharge: 2016-08-21 | Disposition: A | Discharge status: Home / Self Care | Payer: BLUE CROSS/BLUE SHIELD | Source: Ambulatory Visit | Attending: Gastroenterology | Admitting: Gastroenterology

## 2016-08-21 DIAGNOSIS — K769 Liver disease, unspecified: Secondary | ICD-10-CM | POA: Diagnosis not present

## 2016-08-21 DIAGNOSIS — K7689 Other specified diseases of liver: Secondary | ICD-10-CM | POA: Insufficient documentation

## 2016-08-21 DIAGNOSIS — K824 Cholesterolosis of gallbladder: Secondary | ICD-10-CM | POA: Diagnosis not present

## 2016-09-12 ENCOUNTER — Other Ambulatory Visit: Payer: Self-pay | Admitting: Surgery

## 2016-09-12 DIAGNOSIS — R1011 Right upper quadrant pain: Secondary | ICD-10-CM

## 2016-09-17 ENCOUNTER — Ambulatory Visit (INDEPENDENT_AMBULATORY_CARE_PROVIDER_SITE_OTHER): Payer: BLUE CROSS/BLUE SHIELD | Admitting: Vascular Surgery

## 2016-09-17 ENCOUNTER — Encounter (INDEPENDENT_AMBULATORY_CARE_PROVIDER_SITE_OTHER): Payer: Self-pay | Admitting: Vascular Surgery

## 2016-09-17 DIAGNOSIS — M79605 Pain in left leg: Secondary | ICD-10-CM | POA: Diagnosis not present

## 2016-09-17 DIAGNOSIS — R2242 Localized swelling, mass and lump, left lower limb: Secondary | ICD-10-CM

## 2016-09-17 DIAGNOSIS — I8393 Asymptomatic varicose veins of bilateral lower extremities: Secondary | ICD-10-CM | POA: Diagnosis not present

## 2016-09-17 DIAGNOSIS — M79609 Pain in unspecified limb: Secondary | ICD-10-CM | POA: Insufficient documentation

## 2016-09-17 NOTE — Progress Notes (Signed)
MRN : [id number]  Tanya Crawford is a 36 y.o. (Dec 16, 1980) female who presents with chief complaint of  Chief Complaint  Patient presents with  . New Patient (Initial Visit)  .  History of Present Illness: The patient is seen for evaluation of symptomatic varicose veins. She notes the left leg is much worse than the right leg.  The patient relates burning and stinging which worsened steadily throughout the course of the day, particularly with standing. The patient also notes an aching and throbbing pain over the left hip. The patient states the pain from the varicose veins interferes with work, daily exercise, shopping and household maintenance. In particular she is very uncomfortable with wearing jeans.  She notes a smaller area of the left more distal thigh.  At this point, the symptoms are persistent and severe enough that they're having a negative impact on lifestyle and are interfering with daily activities.  There is no history of DVT, PE or superficial thrombophlebitis. There is no history of ulceration or hemorrhage. The patient denies a significant family history of varicose veins.  The patient has not worn graduated compression in the past. At the present time the patient has not been using over-the-counter analgesics. There is no history of prior surgical intervention or sclerotherapy.    Current Meds  Medication Sig  . diazepam (VALIUM) 10 MG tablet Take 10 mg by mouth 3 (three) times daily.  . DULoxetine (CYMBALTA) 20 MG capsule Take by mouth.  . zolpidem (AMBIEN) 10 MG tablet Take 10 mg by mouth at bedtime as needed for sleep.     Past Medical History:  Diagnosis Date  . Myocarditis [facility])     Past Surgical History:  Procedure Laterality Date  . ABDOMINAL HYSTERECTOMY    . CESAREAN SECTION    . TONSILLECTOMY      Social History Social History  Substance Use Topics  . Smoking status: Current Every Day Smoker    Packs/day: 0.50    Types: Cigarettes  .  Smokeless tobacco: Never Used  . Alcohol use No    Family History Family History  Problem Relation Age of Onset  . Cancer Mother   . Stroke Mother   . Varicose Veins Mother   . Cancer Father   . Congestive Heart Failure Father   . Cancer Other   . Hyperlipidemia Maternal Grandmother   . Heart attack Maternal Grandfather   . Congestive Heart Failure Paternal Grandfather   No family history of bleeding/clotting disorders, porphyria or autoimmune disease   Allergies  Allergen Reactions  . Latex      REVIEW OF SYSTEMS (Negative unless checked)  Constitutional: [] Weight loss  [] Fever  [] Chills Cardiac: [] Chest pain   [] Chest pressure   [] Palpitations   [] Shortness of breath when laying flat   [] Shortness of breath with exertion. Vascular:  [] Pain in legs with walking   [x] Pain in legs with standing  [] History of DVT   [] Phlebitis   [x] Swelling in legs   [x] Varicose veins   [] Non-healing ulcers Pulmonary:   [] Uses home oxygen   [] Productive cough   [] Hemoptysis   [] Wheeze  [] COPD   [] Asthma Neurologic:  [] Dizziness   [] Seizures   [x] History of fibromyalgia   [] History of TIA  [] Aphasia   [] Vissual changes   [] Weakness or numbness in arm   [] Weakness or numbness in leg Musculoskeletal:   [] Joint swelling   [] Joint pain   [] Low back pain Hematologic:  [] Easy bruising  [] Easy bleeding   []   Hypercoagulable state   [] Anemic Gastrointestinal:  [] Diarrhea   [] Vomiting  [] Gastroesophageal reflux/heartburn   [] Difficulty swallowing. Genitourinary:  [] Chronic kidney disease   [] Difficult urination  [] Frequent urination   [] Blood in urine Skin:  [] Rashes   [] Ulcers  Psychological:  [] History of anxiety   []  History of major depression.  Physical Examination  Vitals:   09/17/16 1525  BP: 104/76  Pulse: 82  Resp: 18  Weight: 129 lb (58.5 kg)  Height: 5' 5.5" (1.664 m)   Body mass index is 21.14 kg/m. Gen: WD/WN, NAD Head: [place]/AT, No temporalis wasting.  Ear/Nose/Throat: Hearing  grossly intact, nares w/o erythema or drainage, poor dentition Eyes: PER, EOMI, sclera nonicteric.  Neck: Supple, no masses.  No bruit or JVD.  Pulmonary:  Good air movement, clear to auscultation bilaterally, no use of accessory muscles.  Cardiac: RRR, normal S1, S2, no Murmurs. Vascular: there are scattered small reticular veins and spider veins bilaterally.  The area that is most concerning to her has a soft tender palpable mass with a smaller similar mass more distally Vessel Right Left  Radial Palpable Palpable  Ulnar Palpable Palpable  Brachial Palpable Palpable  Carotid Palpable Palpable  Femoral Palpable Palpable  Popliteal Palpable Palpable  PT Palpable Palpable  DP Palpable Palpable   Gastrointestinal: soft, non-distended. No guarding/no peritoneal signs.  Musculoskeletal: M/S 5/5 throughout.  No deformity or atrophy.  Neurologic: CN 2-12 intact. Pain and light touch intact in extremities.  Symmetrical.  Speech is fluent. Motor exam as listed above. Psychiatric: Judgment intact, Mood & affect appropriate for pt's clinical situation. Dermatologic: No rashes or ulcers noted.  No changes consistent with cellulitis. Lymph : No Cervical lymphadenopathy, no lichenification or skin changes of chronic lymphedema.  CBC Lab Results  Component Value Date   WBC 12.2 (H) 05/24/2016   HGB 14.9 05/24/2016   HCT 42.7 05/24/2016   MCV 90.9 05/24/2016   PLT 156 05/24/2016    BMET    Component Value Date/Time   NA 138 05/24/2016 2211   NA 138 04/13/2013 2244   K 3.5 05/24/2016 2211   K 3.2 (L) 04/13/2013 2244   CL 106 05/24/2016 2211   CL 106 04/13/2013 2244   CO2 25 05/24/2016 2211   CO2 28 04/13/2013 2244   GLUCOSE 99 05/24/2016 2211   GLUCOSE 94 04/13/2013 2244   BUN 9 05/24/2016 2211   BUN 5 (L) 04/13/2013 2244   CREATININE 0.82 05/24/2016 2211   CREATININE 1.03 04/13/2013 2244   CALCIUM 9.0 05/24/2016 2211   CALCIUM 8.8 04/13/2013 2244   GFRNONAA >60 05/24/2016 2211    GFRNONAA >60 04/13/2013 2244   GFRAA >60 05/24/2016 2211   GFRAA >60 04/13/2013 2244   CrCl cannot be calculated (Patient's most recent lab result is older than the maximum 21 days allowed.).  COAG No results found for: INR, PROTIME  Radiology US Abdomen Complete  Result Date: 08/21/2016 CLINICAL DATA:  Nonspecific sub-cm hepatic lesions seen on previous CT. EXAM: ABDOMEN ULTRASOUND COMPLETE COMPARISON:  CT on 05/25/2016 and ultrasound on 03/27/2016 FINDINGS: Gallbladder: No gallstones or wall thickening visualized. No sonographic Murphy sign noted by sonographer. Tiny nonmobile gallbladder polyp again seen measuring approximately 3 x 6 mm. Common bile duct: Diameter: 4 mm, within normal limits. Liver: Within normal limits in parenchymal echogenicity. A tiny 4 mm cyst is seen within the right hepatic lobe. No other hepatic lesions identified. IVC: No abnormality visualized. Pancreas: Visualized portion unremarkable. Spleen: Size and appearance within normal limits. Right  Kidney: Length: 10.9 cm. Echogenicity within normal limits. No mass or hydronephrosis visualized. Left Kidney: Length: 11.1 cm. Echogenicity within normal limits. No mass or hydronephrosis visualized. Abdominal aorta: No aneurysm visualized. Other findings: None. IMPRESSION: Tiny gallbladder polyp again noted. No evidence of cholelithiasis or biliary ductal dilatation. Tiny 4 mm right hepatic lobe cyst.  No hepatic mass visualized. No acute findings. Electronically Signed   By: Earle Gell M.D.   On: 08/21/2016 11:01    Assessment/Plan 1. Pain of left lower extremity The area of concern for the patient is most consistent with a lipoma.  I did ultrasound the area and this to is suggesting a lipoma  Given it is increasing in size and becoming more painful I have recommended general surgery evaluation.  I will refer to Dr C. Woodham  2. Mass of left lower leg See #1  3. Varicose veins of both lower extremities Diffuse and  likely asymptomatic  Easily treated with sclerotherapy but this would be considered cosmetic    Hortencia Pilar, MD  09/17/2016 9:08 PM

## 2016-09-20 ENCOUNTER — Encounter (INDEPENDENT_AMBULATORY_CARE_PROVIDER_SITE_OTHER): Payer: Self-pay | Admitting: Vascular Surgery

## 2016-09-24 ENCOUNTER — Encounter (INDEPENDENT_AMBULATORY_CARE_PROVIDER_SITE_OTHER): Payer: Self-pay | Admitting: Vascular Surgery

## 2016-09-25 ENCOUNTER — Telehealth: Payer: Self-pay

## 2016-09-25 NOTE — Telephone Encounter (Signed)
Patient is on schedule for a Consultation for Lipoma removal in her leg. She is currently under the care of Dr. Rochel Brome for Gallbladder work up. We will defer to Dr. Tamala Julian for this Lipoma. Called Dr. Thompson Caul office and spoke with receptionist so that they may call patient to make appointment for Lipoma. Call made to patient and explained that Dr. Tamala Julian will see her for Lipoma as well. She verbalizes understanding of this.

## 2016-10-02 ENCOUNTER — Ambulatory Visit: Payer: Self-pay | Admitting: Surgery

## 2016-10-02 ENCOUNTER — Ambulatory Visit
Admission: RE | Admit: 2016-10-02 | Discharge: 2016-10-02 | Disposition: A | Discharge status: Home / Self Care | Payer: BLUE CROSS/BLUE SHIELD | Source: Ambulatory Visit | Attending: Surgery | Admitting: Surgery

## 2016-10-02 DIAGNOSIS — R1011 Right upper quadrant pain: Secondary | ICD-10-CM | POA: Diagnosis present

## 2016-10-03 ENCOUNTER — Other Ambulatory Visit: Payer: Self-pay | Admitting: Surgery

## 2016-10-03 DIAGNOSIS — R1011 Right upper quadrant pain: Secondary | ICD-10-CM

## 2016-10-08 ENCOUNTER — Ambulatory Visit: Payer: BLUE CROSS/BLUE SHIELD

## 2016-10-11 ENCOUNTER — Encounter
Admission: RE | Admit: 2016-10-11 | Discharge: 2016-10-11 | Disposition: A | Discharge status: Home / Self Care | Payer: BLUE CROSS/BLUE SHIELD | Source: Ambulatory Visit | Attending: Surgery | Admitting: Surgery

## 2016-10-11 DIAGNOSIS — R1011 Right upper quadrant pain: Secondary | ICD-10-CM | POA: Insufficient documentation

## 2016-10-11 MED ORDER — TECHNETIUM TC 99M MEBROFENIN IV KIT
5.0000 | PACK | Freq: Once | INTRAVENOUS | Status: AC | PRN
  Administered 2016-10-11: 5.07 via INTRAVENOUS

## 2016-10-16 ENCOUNTER — Ambulatory Visit
Admission: EM | Admit: 2016-10-16 | Discharge: 2016-10-16 | Disposition: A | Discharge status: Home / Self Care | Payer: BLUE CROSS/BLUE SHIELD | Attending: Family Medicine | Admitting: Family Medicine

## 2016-10-16 ENCOUNTER — Encounter: Payer: Self-pay | Admitting: *Deleted

## 2016-10-16 DIAGNOSIS — M545 Low back pain, unspecified: Secondary | ICD-10-CM

## 2016-10-16 DIAGNOSIS — S39012A Strain of muscle, fascia and tendon of lower back, initial encounter: Secondary | ICD-10-CM | POA: Diagnosis not present

## 2016-10-16 HISTORY — DX: Low back pain, unspecified: M54.50

## 2016-10-16 MED ORDER — CYCLOBENZAPRINE HCL 10 MG PO TABS: 10.0000 mg | ORAL_TABLET | Freq: Three times a day (TID) | ORAL | 0 refills | Status: DC | PRN

## 2016-10-16 MED ORDER — HYDROCODONE-ACETAMINOPHEN 5-325 MG PO TABS: ORAL_TABLET | ORAL | 0 refills | Status: DC

## 2016-10-16 NOTE — ED Provider Notes (Signed)
[facility]    CSN: [id number] Arrival date & time: 10/16/16  1111     History   Chief Complaint Chief Complaint  Patient presents with  . Back Pain    HPI Tanya Crawford is a 36 y.o. female.    Back Pain  Location:  Lumbar spine Quality:  Aching Radiates to:  Does not radiate Pain severity:  Moderate Pain is:  Same all the time Onset quality:  Sudden Duration:  2 days Timing:  Constant Progression:  Worsening Chronicity:  Recurrent Context: twisting   Context comment:  Bending Relieved by:  Nothing Ineffective treatments:  OTC medications Associated symptoms: no abdominal pain, no abdominal swelling, no bladder incontinence, no bowel incontinence, no chest pain, no dysuria, no fever, no headaches, no leg pain, no numbness, no paresthesias, no pelvic pain, no perianal numbness, no tingling, no weakness and no weight loss     Past Medical History:  Diagnosis Date  . Myocarditis [facility])     Patient Active Problem List   Diagnosis Date Noted  . Pain in limb 09/17/2016  . Mass of left lower leg 09/17/2016  . Varicose veins of both lower extremities 09/17/2016    Past Surgical History:  Procedure Laterality Date  . ABDOMINAL HYSTERECTOMY    . CESAREAN SECTION    . TONSILLECTOMY      OB History    No data available       Home Medications    Prior to Admission medications   Medication Sig Start Date End Date Taking? Authorizing Provider  diazepam (VALIUM) 10 MG tablet Take 10 mg by mouth daily as needed for anxiety.    Yes Historical Provider, MD  DULoxetine (CYMBALTA) 20 MG capsule Take 40 mg by mouth daily.  07/12/16 01/08/17 Yes Historical Provider, MD  Multiple Vitamins-Minerals (HAIR SKIN AND NAILS FORMULA PO) Take 3 tablets by mouth daily.   Yes Historical Provider, MD  zolpidem (AMBIEN) 10 MG tablet Take 10 mg by mouth at bedtime.    Yes Historical Provider, MD  cyclobenzaprine (FLEXERIL) 10 MG tablet Take 1 tablet (10 mg total) by  mouth 3 (three) times daily as needed for muscle spasms. 10/16/16   Norval Gable, MD  HYDROcodone-acetaminophen (NORCO/VICODIN) 5-325 MG tablet 1-2 tabs po bid prn 10/16/16   Norval Gable, MD    Family History Family History  Problem Relation Age of Onset  . Cancer Mother   . Stroke Mother   . Varicose Veins Mother   . Cancer Father   . Congestive Heart Failure Father   . Cancer Other   . Hyperlipidemia Maternal Grandmother   . Heart attack Maternal Grandfather   . Congestive Heart Failure Paternal Grandfather     Social History Social History  Substance Use Topics  . Smoking status: Current Every Day Smoker    Packs/day: 0.50    Types: Cigarettes  . Smokeless tobacco: Never Used  . Alcohol use No     Allergies   Latex   Review of Systems Review of Systems  Constitutional: Negative for fever and weight loss.  Cardiovascular: Negative for chest pain.  Gastrointestinal: Negative for abdominal pain and bowel incontinence.  Genitourinary: Negative for bladder incontinence, dysuria and pelvic pain.  Musculoskeletal: Positive for back pain.  Neurological: Negative for tingling, weakness, numbness, headaches and paresthesias.     Physical Exam Triage Vital Signs ED Triage Vitals  Enc Vitals Group     BP 10/16/16 1136 118/78     Pulse  Rate 10/16/16 1136 71     Resp 10/16/16 1136 16     Temp 10/16/16 1136 98.2 F (36.8 C)     Temp Source 10/16/16 1136 Oral     SpO2 10/16/16 1136 99 %     Weight 10/16/16 1137 130 lb (59 kg)     Height 10/16/16 1137 5\' 5"  (1.651 m)     Head Circumference --      Peak Flow --      Pain Score 10/16/16 1139 9     Pain Loc --      Pain Edu? --      Excl. in [place]? --    No data found.   Updated Vital Signs BP 118/78 (BP Location: Left Arm)   Pulse 71   Temp 98.2 F (36.8 C) (Oral)   Resp 16   Ht 5\' 5"  (1.651 m)   Wt 130 lb (59 kg)   SpO2 99%   BMI 21.63 kg/m   Visual Acuity Right Eye Distance:   Left Eye Distance:     Bilateral Distance:    Right Eye Near:   Left Eye Near:    Bilateral Near:     Physical Exam  Constitutional: She appears well-developed and well-nourished. No distress.  Musculoskeletal: She exhibits tenderness. She exhibits no edema.       Lumbar back: She exhibits tenderness (paraspinous muscles) and spasm. She exhibits normal range of motion, no bony tenderness, no swelling, no edema, no deformity, no laceration, no pain and normal pulse.  Neurological: She is alert. She has normal reflexes. She displays normal reflexes. She exhibits normal muscle tone.  Skin: Skin is warm and dry. No rash noted. She is not diaphoretic. No erythema.  Nursing note and vitals reviewed.    UC Treatments / Results  Labs (all labs ordered are listed, but only abnormal results are displayed) Labs Reviewed - No data to display  EKG  EKG Interpretation None       Radiology No results found.  Procedures Procedures (including critical care time)  Medications Ordered in UC Medications - No data to display   Initial Impression / Assessment and Plan / UC Course  I have reviewed the triage vital signs and the nursing notes.  Pertinent labs & imaging results that were available during my care of the patient were reviewed by me and considered in my medical decision making (see chart for details).      Final Clinical Impressions(s) / UC Diagnoses   Final diagnoses:  Strain of lumbar region, initial encounter    New Prescriptions Discharge Medication List as of 10/16/2016 11:56 AM    START taking these medications   Details  cyclobenzaprine (FLEXERIL) 10 MG tablet Take 1 tablet (10 mg total) by mouth 3 (three) times daily as needed for muscle spasms., Starting Tue 10/16/2016, Normal    HYDROcodone-acetaminophen (NORCO/VICODIN) 5-325 MG tablet 1-2 tabs po bid prn, Print       1. diagnosis reviewed with patient 2. rx as per orders above; reviewed possible side effects, interactions,  risks and benefits  3. Recommend supportive treatment with heat, stretch  4. Follow-up prn if symptoms worsen or don't improve   Norval Gable, MD 10/16/16 1658

## 2016-10-16 NOTE — ED Triage Notes (Signed)
Patient was bending over at her coffee table when she felt a sharp pain in her lower back yesterday. Patient does have a history of back issues.

## 2016-10-17 ENCOUNTER — Encounter
Admission: RE | Admit: 2016-10-17 | Discharge: 2016-10-17 | Disposition: A | Discharge status: Home / Self Care | Payer: BLUE CROSS/BLUE SHIELD | Source: Ambulatory Visit | Attending: Surgery | Admitting: Surgery

## 2016-10-17 HISTORY — DX: Headache, unspecified: R51.9

## 2016-10-17 HISTORY — DX: Headache: R51

## 2016-10-17 HISTORY — DX: Anemia, unspecified: D64.9

## 2016-10-17 NOTE — Patient Instructions (Signed)
  Your procedure is scheduled on: 10-23-16 Report to Same Day Surgery 2nd floor medical mall [facility] Entrance-take elevator on left to 2nd floor.  Check in with surgery information desk.) To find out your arrival time please call [phone] between 1PM - 3PM on 10-22-16  Remember: Instructions that are not followed completely may result in serious medical risk, up to and including death, or upon the discretion of your surgeon and anesthesiologist your surgery may need to be rescheduled.    _x___ 1. Do not eat food or drink liquids after midnight. No gum chewing or  hard candies.     __x__ 2. No Alcohol for 24 hours before or after surgery.   __x__3. No Smoking for 24 prior to surgery.   ____  4. Bring all medications with you on the day of surgery if instructed.    __x__ 5. Notify your doctor if there is any change in your medical condition     (cold, fever, infections).     Do not wear jewelry, make-up, hairpins, clips or nail polish.  Do not wear lotions, powders, or perfumes. You may wear deodorant.  Do not shave 48 hours prior to surgery. Men may shave face and neck.  Do not bring valuables to the hospital.    [facility] is not responsible for any belongings or valuables.               Contacts, dentures or bridgework may not be worn into surgery.  Leave your suitcase in the car. After surgery it may be brought to your room.  For patients admitted to the hospital, discharge time is determined by your                       treatment team.   Patients discharged the day of surgery will not be allowed to drive home.  You will need someone to drive you home and stay with you the night of your procedure.    Please read over the following fact sheets that you were given:    ____ Take anti-hypertensive (unless it includes a diuretic), cardiac, seizure, asthma,     anti-reflux and psychiatric medicines. These include:  1. NONE  2.  3.  4.  5.  6.  ____Fleets enema or  Magnesium Citrate as directed.   ____ Use CHG Soap or sage wipes as directed on instruction sheet   ____ Use inhalers on the day of surgery and bring to hospital day of surgery  ____ Stop Metformin and Janumet 2 days prior to surgery.    ____ Take 1/2 of usual insulin dose the night before surgery and none on the morning     surgery.   ____ Follow recommendations from Cardiologist, Pulmonologist or PCP regarding          stopping Aspirin, Coumadin, Pllavix ,Eliquis, Effient, or Pradaxa, and Pletal.  X____Stop Anti-inflammatories such as Advil, Aleve, Ibuprofen, Motrin, Naproxen, Naprosyn, Goodies powders or aspirin products. OK to take Tylenol    _x___ Stop supplements until after surgery-STOP HAIR, SKIN AND NAILS NOW   ____ Bring C-Pap to the hospital.

## 2016-10-22 ENCOUNTER — Encounter: Payer: Self-pay | Admitting: *Deleted

## 2016-10-23 ENCOUNTER — Ambulatory Visit: Payer: BLUE CROSS/BLUE SHIELD | Admitting: Anesthesiology

## 2016-10-23 ENCOUNTER — Ambulatory Visit
Admission: RE | Admit: 2016-10-23 | Discharge: 2016-10-23 | Disposition: A | Discharge status: Home / Self Care | Payer: BLUE CROSS/BLUE SHIELD | Source: Ambulatory Visit | Attending: Surgery | Admitting: Surgery

## 2016-10-23 ENCOUNTER — Encounter
Admission: RE | Disposition: A | Discharge status: Home / Self Care | Payer: Self-pay | Source: Ambulatory Visit | Attending: Surgery

## 2016-10-23 ENCOUNTER — Ambulatory Visit: Payer: BLUE CROSS/BLUE SHIELD

## 2016-10-23 DIAGNOSIS — Z419 Encounter for procedure for purposes other than remedying health state, unspecified: Secondary | ICD-10-CM

## 2016-10-23 DIAGNOSIS — K811 Chronic cholecystitis: Secondary | ICD-10-CM | POA: Insufficient documentation

## 2016-10-23 DIAGNOSIS — F17210 Nicotine dependence, cigarettes, uncomplicated: Secondary | ICD-10-CM | POA: Insufficient documentation

## 2016-10-23 DIAGNOSIS — D1724 Benign lipomatous neoplasm of skin and subcutaneous tissue of left leg: Secondary | ICD-10-CM | POA: Insufficient documentation

## 2016-10-23 DIAGNOSIS — Z79899 Other long term (current) drug therapy: Secondary | ICD-10-CM | POA: Insufficient documentation

## 2016-10-23 HISTORY — PX: LIPOMA EXCISION: SHX5283

## 2016-10-23 HISTORY — PX: CHOLECYSTECTOMY: SHX55

## 2016-10-23 HISTORY — DX: Low back pain: M54.5

## 2016-10-23 SURGERY — EXCISION LIPOMA
Anesthesia: General | Wound class: Clean

## 2016-10-23 MED ORDER — FAMOTIDINE 20 MG PO TABS
ORAL_TABLET | ORAL | Status: AC
  Administered 2016-10-23: 20 mg via ORAL
  Filled 2016-10-23: qty 1

## 2016-10-23 MED ORDER — PROMETHAZINE HCL 25 MG/ML IJ SOLN
INTRAMUSCULAR | Status: AC
  Administered 2016-10-23: 6.25 mg via INTRAVENOUS
  Filled 2016-10-23: qty 1

## 2016-10-23 MED ORDER — PROMETHAZINE HCL 25 MG/ML IJ SOLN
6.2500 mg | INTRAMUSCULAR | Status: DC | PRN
  Administered 2016-10-23: 6.25 mg via INTRAVENOUS

## 2016-10-23 MED ORDER — FENTANYL CITRATE (PF) 100 MCG/2ML IJ SOLN
25.0000 ug | INTRAMUSCULAR | Status: DC | PRN
  Administered 2016-10-23 (×3): 50 ug via INTRAVENOUS

## 2016-10-23 MED ORDER — FENTANYL CITRATE (PF) 100 MCG/2ML IJ SOLN
INTRAMUSCULAR | Status: AC
  Filled 2016-10-23: qty 2

## 2016-10-23 MED ORDER — MIDAZOLAM HCL 2 MG/2ML IJ SOLN
INTRAMUSCULAR | Status: DC | PRN
  Administered 2016-10-23: 2 mg via INTRAVENOUS

## 2016-10-23 MED ORDER — SODIUM CHLORIDE 0.9 % IJ SOLN
INTRAMUSCULAR | Status: AC
  Administered 2016-10-23: 1000 mL
  Filled 2016-10-23: qty 10

## 2016-10-23 MED ORDER — BUPIVACAINE-EPINEPHRINE (PF) 0.5% -1:200000 IJ SOLN
INTRAMUSCULAR | Status: AC
  Filled 2016-10-23: qty 30

## 2016-10-23 MED ORDER — OXYCODONE HCL 5 MG PO TABS
5.0000 mg | ORAL_TABLET | Freq: Once | ORAL | Status: AC | PRN
  Administered 2016-10-23: 5 mg via ORAL

## 2016-10-23 MED ORDER — ONDANSETRON HCL 4 MG/2ML IJ SOLN
INTRAMUSCULAR | Status: DC | PRN
  Administered 2016-10-23: 4 mg via INTRAVENOUS

## 2016-10-23 MED ORDER — HEPARIN SODIUM (PORCINE) 5000 UNIT/ML IJ SOLN
INTRAMUSCULAR | Status: AC
  Filled 2016-10-23: qty 1

## 2016-10-23 MED ORDER — HYDROCODONE-ACETAMINOPHEN 5-325 MG PO TABS: 1.0000 | ORAL_TABLET | ORAL | Status: DC | PRN

## 2016-10-23 MED ORDER — MEPERIDINE HCL 50 MG/ML IJ SOLN: 6.2500 mg | INTRAMUSCULAR | Status: DC | PRN

## 2016-10-23 MED ORDER — SUGAMMADEX SODIUM 200 MG/2ML IV SOLN
INTRAVENOUS | Status: DC | PRN
  Administered 2016-10-23: 118 mg via INTRAVENOUS

## 2016-10-23 MED ORDER — DEXAMETHASONE SODIUM PHOSPHATE 10 MG/ML IJ SOLN
INTRAMUSCULAR | Status: AC
  Filled 2016-10-23: qty 1

## 2016-10-23 MED ORDER — FENTANYL CITRATE (PF) 100 MCG/2ML IJ SOLN
INTRAMUSCULAR | Status: AC
  Administered 2016-10-23: 50 ug via INTRAVENOUS
  Filled 2016-10-23: qty 2

## 2016-10-23 MED ORDER — MIDAZOLAM HCL 2 MG/2ML IJ SOLN
INTRAMUSCULAR | Status: AC
  Filled 2016-10-23: qty 2

## 2016-10-23 MED ORDER — PROPOFOL 10 MG/ML IV BOLUS
INTRAVENOUS | Status: AC
  Filled 2016-10-23: qty 20

## 2016-10-23 MED ORDER — LIDOCAINE HCL (CARDIAC) 20 MG/ML IV SOLN
INTRAVENOUS | Status: DC | PRN
  Administered 2016-10-23: 60 mg via INTRAVENOUS

## 2016-10-23 MED ORDER — LACTATED RINGERS IV SOLN
INTRAVENOUS | Status: DC
  Administered 2016-10-23 (×2): via INTRAVENOUS

## 2016-10-23 MED ORDER — ROCURONIUM BROMIDE 50 MG/5ML IV SOLN
INTRAVENOUS | Status: AC
  Filled 2016-10-23: qty 1

## 2016-10-23 MED ORDER — OXYCODONE HCL 5 MG PO TABS
ORAL_TABLET | ORAL | Status: AC
  Filled 2016-10-23: qty 1

## 2016-10-23 MED ORDER — LIDOCAINE HCL (PF) 1 % IJ SOLN
INTRAMUSCULAR | Status: AC
  Filled 2016-10-23: qty 2

## 2016-10-23 MED ORDER — ROCURONIUM BROMIDE 100 MG/10ML IV SOLN
INTRAVENOUS | Status: DC | PRN
  Administered 2016-10-23: 50 mg via INTRAVENOUS
  Administered 2016-10-23: 10 mg via INTRAVENOUS

## 2016-10-23 MED ORDER — DEXAMETHASONE SODIUM PHOSPHATE 10 MG/ML IJ SOLN
INTRAMUSCULAR | Status: DC | PRN
  Administered 2016-10-23: 8 mg via INTRAVENOUS

## 2016-10-23 MED ORDER — SODIUM CHLORIDE 0.9 % IJ SOLN
INTRAMUSCULAR | Status: AC
  Filled 2016-10-23: qty 50

## 2016-10-23 MED ORDER — FENTANYL CITRATE (PF) 100 MCG/2ML IJ SOLN
INTRAMUSCULAR | Status: DC | PRN
  Administered 2016-10-23 (×2): 100 ug via INTRAVENOUS

## 2016-10-23 MED ORDER — BUPIVACAINE-EPINEPHRINE (PF) 0.5% -1:200000 IJ SOLN
INTRAMUSCULAR | Status: DC | PRN
  Administered 2016-10-23: 7 mL via PERINEURAL

## 2016-10-23 MED ORDER — FAMOTIDINE 20 MG PO TABS
20.0000 mg | ORAL_TABLET | Freq: Once | ORAL | Status: AC
  Administered 2016-10-23: 20 mg via ORAL

## 2016-10-23 MED ORDER — OXYCODONE HCL 5 MG/5ML PO SOLN: 5.0000 mg | Freq: Once | ORAL | Status: AC | PRN

## 2016-10-23 MED ORDER — HYDROCODONE-ACETAMINOPHEN 5-325 MG PO TABS: 1.0000 | ORAL_TABLET | ORAL | 0 refills | Status: DC | PRN

## 2016-10-23 MED ORDER — PROPOFOL 10 MG/ML IV BOLUS
INTRAVENOUS | Status: DC | PRN
Start: 2016-10-23 — End: 2016-10-23
  Administered 2016-10-23: 130 mg via INTRAVENOUS

## 2016-10-23 SURGICAL SUPPLY — 42 items
APPLIER CLIP ROT 10 11.4 M/L (STAPLE) ×5
CANISTER SUCT 1200ML W/VALVE (MISCELLANEOUS) ×5 IMPLANT
CANNULA DILATOR 10 W/SLV (CANNULA) ×4 IMPLANT
CANNULA DILATOR 10MM W/SLV (CANNULA) ×1
CATH REDDICK CHOLANGI 4FR 50CM (CATHETERS) ×5 IMPLANT
CHLORAPREP W/TINT 26ML (MISCELLANEOUS) ×5 IMPLANT
CLIP APPLIE ROT 10 11.4 M/L (STAPLE) ×3 IMPLANT
CLOSURE WOUND 1/2 X4 (GAUZE/BANDAGES/DRESSINGS) ×1
DRAPE SHEET LG 3/4 BI-LAMINATE (DRAPES) ×5 IMPLANT
ELECT REM PT RETURN 9FT ADLT (ELECTROSURGICAL) ×5
ELECTRODE REM PT RTRN 9FT ADLT (ELECTROSURGICAL) ×3 IMPLANT
GAUZE SPONGE 4X4 12PLY STRL (GAUZE/BANDAGES/DRESSINGS) ×5 IMPLANT
GLOVE BIO SURGEON STRL SZ 6.5 (GLOVE) ×16 IMPLANT
GLOVE BIO SURGEON STRL SZ7.5 (GLOVE) ×5 IMPLANT
GLOVE BIO SURGEONS STRL SZ 6.5 (GLOVE) ×4
GLOVE BIOGEL PI IND STRL 7.0 (GLOVE) ×12 IMPLANT
GLOVE BIOGEL PI INDICATOR 7.0 (GLOVE) ×8
GLOVE SURG SYN 6.5 ES PF (GLOVE) ×20 IMPLANT
GOWN STRL REUS W/ TWL LRG LVL3 (GOWN DISPOSABLE) ×24 IMPLANT
GOWN STRL REUS W/TWL LRG LVL3 (GOWN DISPOSABLE) ×16
IRRIGATION STRYKERFLOW (MISCELLANEOUS) ×3 IMPLANT
IRRIGATOR STRYKERFLOW (MISCELLANEOUS) ×5
IV NS 1000ML (IV SOLUTION) ×2
IV NS 1000ML BAXH (IV SOLUTION) ×3 IMPLANT
KIT RM TURNOVER STRD PROC AR (KITS) ×5 IMPLANT
LABEL OR SOLS (LABEL) ×5 IMPLANT
NDL INSUFF ACCESS 14 VERSASTEP (NEEDLE) ×5 IMPLANT
NEEDLE FILTER BLUNT 18X 1/2SAF (NEEDLE) ×2
NEEDLE FILTER BLUNT 18X1 1/2 (NEEDLE) ×3 IMPLANT
NS IRRIG 500ML POUR BTL (IV SOLUTION) ×10 IMPLANT
PACK LAP CHOLECYSTECTOMY (MISCELLANEOUS) ×5 IMPLANT
SCISSORS METZENBAUM CVD 33 (INSTRUMENTS) ×5 IMPLANT
SEAL FOR SCOPE WARMER C3101 (MISCELLANEOUS) ×5 IMPLANT
SLEEVE ENDOPATH XCEL 5M (ENDOMECHANICALS) ×5 IMPLANT
STRIP CLOSURE SKIN 1/2X4 (GAUZE/BANDAGES/DRESSINGS) ×4 IMPLANT
SUT CHROMIC 5 0 RB 1 27 (SUTURE) ×10 IMPLANT
SUT VIC AB 0 CT2 27 (SUTURE) IMPLANT
SYR 3ML LL SCALE MARK (SYRINGE) ×5 IMPLANT
TROCAR XCEL NON-BLD 11X100MML (ENDOMECHANICALS) ×5 IMPLANT
TROCAR XCEL NON-BLD 5MMX100MML (ENDOMECHANICALS) ×5 IMPLANT
TUBING INSUFFLATOR HI FLOW (MISCELLANEOUS) ×5 IMPLANT
WATER STERILE IRR 1000ML POUR (IV SOLUTION) ×5 IMPLANT

## 2016-10-23 NOTE — Discharge Instructions (Addendum)
Take Tylenol or Norco if needed for pain.  Should not drive or do anything dangerous when taking Norco.  Remove dressings on Wednesday. May shower Thursday.  Avoid straining and heavy lifting for the first week after surgery.  AMBULATORY SURGERY  DISCHARGE INSTRUCTIONS   1) The drugs that you were given will stay in your system until tomorrow so for the next 24 hours you should not:  A) Drive an automobile B) Make any legal decisions C) Drink any alcoholic beverage   2) You may resume regular meals tomorrow.  Today it is better to start with liquids and gradually work up to solid foods.  You may eat anything you prefer, but it is better to start with liquids, then soup and crackers, and gradually work up to solid foods.   3) Please notify your doctor immediately if you have any unusual bleeding, trouble breathing, redness and pain at the surgery site, drainage, fever, or pain not relieved by medication.    4) Additional Instructions: TAKE A STOOL SOFTENER TWICE A DAY WHILE TAKING NARCOTIC PAIN MEDICINE TO PREVENT CONSTIPATION   Please contact your physician with any problems or Same Day Surgery at [phone], Monday through Friday 6 am to 4 pm, or [place] at [facility] number at [phone].

## 2016-10-23 NOTE — Transfer of Care (Signed)
Immediate Anesthesia Transfer of Care Note  Patient: Tanya Crawford  Procedure(s) Performed: Procedure(s): EXCISION LIPOMA THIGH (Left) LAPAROSCOPIC CHOLECYSTECTOMY  Patient Location: PACU  Anesthesia Type:General  Level of Consciousness: awake  Airway & Oxygen Therapy: Patient Spontanous Breathing and Patient connected to face mask oxygen  Post-op Assessment: Report given to RN and Post -op Vital signs reviewed and stable  Post vital signs: Reviewed and stable  Last Vitals:  Vitals:   10/23/16 1107  BP: 112/80  Pulse: 80  Resp: 16  Temp: 36.1 C    Last Pain:  Vitals:   10/23/16 1107  PainSc: 4       Patients Stated Pain Goal: 4 (88/28/00 3491)  Complications: No apparent anesthesia complications

## 2016-10-23 NOTE — Anesthesia Procedure Notes (Signed)
Procedure Name: Intubation Date/Time: 10/23/2016 12:30 PM Performed by: Allean Found Pre-anesthesia Checklist: Emergency Drugs available, Patient identified, Patient being monitored, Timeout performed and Suction available Patient Re-evaluated:Patient Re-evaluated prior to inductionOxygen Delivery Method: Circle system utilized Preoxygenation: Pre-oxygenation with 100% oxygen Intubation Type: IV induction Ventilation: Mask ventilation without difficulty Laryngoscope Size: Mac and 3 Grade View: Grade I Tube type: Oral Tube size: 7.0 mm Number of attempts: 1 Airway Equipment and Method: Stylet Placement Confirmation: ETT inserted through vocal cords under direct vision,  positive ETCO2 and breath sounds checked- equal and bilateral Secured at: 23 cm Tube secured with: Tape Dental Injury: Teeth and Oropharynx as per pre-operative assessment

## 2016-10-23 NOTE — Anesthesia Post-op Follow-up Note (Cosign Needed)
Anesthesia QCDR form completed.        

## 2016-10-23 NOTE — Op Note (Signed)
OPERATIVE REPORT  PREOPERATIVE DIAGNOSIS:  Chronic cholecystitis , subcutaneous mass of left thigh  POSTOPERATIVE DIAGNOSIS: Chronic cholecystitis , subcutaneous mass of left thigh  PROCEDURE: Laparoscopic cholecystectomy , excision of subcutaneous mass of left thigh  ANESTHESIA: General  SURGEON: Rochel Brome M.D.  INDICATIONS: She has history of intermittent right upper quadrant abdominal pains. Ultrasound demonstrated a polypoid mass in her gallbladder. Hepatobiliary scan was with a gallbladder ejection fraction of 36%. Due to the repetitive nature of the pain and location under surgery was recommended for definitive treatment  She also has had a palpable mass in the left side which has been symptomatic with pain and excision recommended.  With the patient on the operating table in the supine position under general endotracheal anesthesia the abdomen was prepared with ChloraPrep solution and draped in a sterile manner. A short incision was made in the inferior aspect of the umbilicus and carried down to the deep fascia which was grasped with a laryngeal hook. A Veress needle was inserted aspirated and irrigated with a saline solution. The peritoneal cavity was insufflated with carbon dioxide. The Veress needle was removed. The 10 mm cannula was inserted. The 10 mm 0 laparoscope was inserted to view the peritoneal cavity.  Another incision was made in the epigastrium slightly to the right of the midline to introduce an 11 mm cannula. 2 incisions were made in the lateral aspect of the right upper quadrant to introduce 2   5 mm cannulas. Initial inspection revealed some gaseous distention of the stomach and the anesthetist inserted that oral gastric tube to decompress the stomach. The liver appeared smooth.  The gallbladder was retracted towards the right shoulder.  The gallbladder neck was retracted inferiorly and laterally. Several adhesions were taken down with blunt and sharp dissection.  The  porta hepatis was identified. The gallbladder was mobilized with incision of the visceral peritoneum. The cystic duct was dissected free from surrounding structures. The cystic artery was dissected free from surrounding structures. A critical view of safety was demonstrated  An Endo Clip was placed across the cystic duct adjacent to the gallbladder neck. An incision was made in the cystic duct to introduce a Reddick catheter. Due to the small size of the cystic duct the catheter would not thread and beyond 5 mm, therefore a cholangiogram was not done. The Reddick catheter was removed. The cystic duct was doubly ligated with endoclips and divided. The cystic artery was controlled with double endoclips and divided. The gallbladder was dissected free from the liver with use of hook and cautery and blunt dissection. Bleeding was minimal and hemostasis was intact. The gallbladder was delivered up through the infraumbilical incision and submitted for routine pathology. The right upper quadrant was further inspected. Hemostasis was intact. The cannulas removed allowing carbon dioxide to escape from the peritoneal cavity. The skin incisions were closed with interrupted 5-0 chromic subcutaneous suture benzoin and Steri-Strips. Gauze dressings were applied with paper tape.  Seeing the patient was in satisfactory condition and she was then rolled into the right lateral decubitus position and used the beanbag for cushioning and security. The lateral aspect of the left proximal thigh was prepared with ChloraPrep and draped in a sterile manner. A longitudinally oriented incision was made approximately 5 cm in length and carried down through the thin layer of subcutaneous tissues to encounter a lipoma. This lipoma was moderately lobulated and was dissected free from surrounding tissues. The ex vivo measurement was 3.5 cm in dimension. It was submitted  in formalin for routine pathology. Hemostasis was intact. The wound was closed  with running 4-0 Monocryl subcuticular suture and Dermabond.  The patient appeared to be in satisfactory condition and was prepared for transfer to the recovery room  [place].D.

## 2016-10-23 NOTE — Anesthesia Preprocedure Evaluation (Signed)
Anesthesia Evaluation  Patient identified by MRN, date of birth, ID band Patient awake    Reviewed: Allergy & Precautions, NPO status , Patient's Chart, lab work & pertinent test results  History of Anesthesia Complications Negative for: history of anesthetic complications  Airway Mallampati: II  TM Distance: >3 FB Neck ROM: Full    Dental no notable dental hx.    Pulmonary neg sleep apnea, neg COPD, Current Smoker,    breath sounds clear to auscultation- rhonchi (-) wheezing      Cardiovascular Exercise Tolerance: Good (-) hypertension(-) CAD and (-) Past MI  Rhythm:Regular Rate:Normal - Systolic murmurs and - Diastolic murmurs    Neuro/Psych  Headaches, negative psych ROS   GI/Hepatic negative GI ROS, Neg liver ROS,   Endo/Other  negative endocrine ROSneg diabetes  Renal/GU negative Renal ROS     Musculoskeletal negative musculoskeletal ROS (+)   Abdominal (+) - obese,   Peds  Hematology  (+) anemia ,   Anesthesia Other Findings Past Medical History: No date: Anemia     Comment: H/O  No date: Headache     Comment: MIGRAINES-RARE 10/16/2016: Lower back pain     Comment: taking flexeril and hydrocodone No date: 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   Dental advisory given  Plan Discussed  with: CRNA and Anesthesiologist  Anesthesia Plan Comments:         Anesthesia Quick Evaluation

## 2016-10-23 NOTE — H&P (Signed)
Tanya Crawford is an 36 y.o. female.   Chief Complaint: Right upper quadrant pain HPI: She has history of intermittent right upper quadrant pains. Ultrasound demonstrated what appeared to be a small polyp in the gallbladder. Hepatobiliary scan was with low normal ejection fraction of 36%.  She also has history of a painful palpable mass of the lateral aspect of the left thigh.  Past Medical History:  Diagnosis Date  . Anemia    H/O   . Headache    MIGRAINES-RARE  . Lower back pain 10/16/2016   taking flexeril and hydrocodone  . 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 ABDOMINAL HYSTERECTOMY    . CESAREAN SECTION    . TONSILLECTOMY      Family History  Problem Relation Age of Onset  . Cancer Mother   . Stroke Mother   . Varicose Veins Mother   . Cancer Father   . Congestive Heart Failure Father   . Cancer Other   . Hyperlipidemia Maternal Grandmother   . Heart attack Maternal Grandfather   . Congestive Heart Failure Paternal Grandfather    Social History:  reports that she has been smoking Cigarettes.  She has a 7.50 pack-year smoking history. She has never used smokeless tobacco. She reports that she does not drink alcohol or use drugs.  Allergies:  Allergies  Allergen Reactions  . Latex Rash    Medications Prior to Admission  Medication Sig Dispense Refill  . diazepam (VALIUM) 10 MG tablet Take 10 mg by mouth daily as needed for anxiety.     . DULoxetine (CYMBALTA) 20 MG capsule Take 40 mg by mouth at bedtime.     Marland Kitchen HYDROcodone-acetaminophen (NORCO/VICODIN) 5-325 MG tablet 1-2 tabs po bid prn 8 tablet 0  . Multiple Vitamins-Minerals (HAIR SKIN AND NAILS FORMULA PO) Take 3 tablets by mouth daily.    Marland Kitchen zolpidem (AMBIEN) 10 MG tablet Take 10 mg by mouth at bedtime.     . cyclobenzaprine (FLEXERIL) 10 MG tablet Take 1 tablet (10 mg total) by mouth 3 (three) times daily as needed for muscle spasms. (Patient not  taking: Reported on 10/23/2016) 30 tablet 0    No results found for this or any previous visit (from the past 48 hour(s)). No results found.  ROS she reports recent backache over the past week. She reports no other changes in overall condition since the office visit.  Blood pressure 112/80, pulse 80, temperature 97 F (36.1 C), resp. rate 16, height 5\' 5"  (1.651 m), weight 59 kg (130 lb), SpO2 99 %. Physical Exam  GENERAL:  Awake alert and oriented and in no acute distress.  HEENT:  Head is normocephalic.  Pupils are equal reactive to light.  Extraocular movements are intact. Sclera is clear.  Pharynx is clear.  LUNGS:  Clear without rales rhonchi or wheezes.  HEART:  Regular rhythm S1-S2, without murmur.  ABDOMEN: Soft and flat  EXTREMITIES: There is a palpable mass of the lateral aspect of the left thigh which is approximately 5 cm in dimension and smooth and slightly tender. This site was marked with he comes proximally and distally  CLINICAL DATA: Lab work was reviewed. Her hemoglobin and platelet count creatinine and total bilirubin are normal. Assessment/Plan Chronic cholecystitis, symptomatic mass of the lateral aspect of the left side  I discussed the plan for laparoscopic cholecystectomy and excision of the mass of the left thigh  [facility]  Tamala Julian, MD 10/23/2016, 12:28 PM

## 2016-10-24 ENCOUNTER — Encounter: Payer: Self-pay | Admitting: Surgery

## 2016-10-24 NOTE — Anesthesia Postprocedure Evaluation (Signed)
Anesthesia Post Note  Patient: Tanya Crawford  Procedure(s) Performed: Procedure(s) (LRB): EXCISION LIPOMA THIGH (Left) LAPAROSCOPIC CHOLECYSTECTOMY  Patient location during evaluation: PACU Anesthesia Type: General Level of consciousness: awake and alert and oriented Pain management: pain level controlled Vital Signs Assessment: post-procedure vital signs reviewed and stable Respiratory status: spontaneous breathing, nonlabored ventilation and respiratory function stable Cardiovascular status: blood pressure returned to baseline and stable Postop Assessment: no signs of nausea or vomiting Anesthetic complications: no     Last Vitals:  Vitals:   10/23/16 1610 10/23/16 1652  BP: 117/68 118/63  Pulse:  79  Resp: 16 16  Temp: 36.8 C     Last Pain:  Vitals:   10/23/16 1652  TempSrc:   PainSc: 4                  Terralyn Matsumura

## 2016-10-25 LAB — SURGICAL PATHOLOGY

## 2017-07-05 ENCOUNTER — Other Ambulatory Visit: Payer: Self-pay | Admitting: Physical Medicine and Rehabilitation

## 2017-07-05 DIAGNOSIS — M5416 Radiculopathy, lumbar region: Secondary | ICD-10-CM

## 2017-07-12 ENCOUNTER — Ambulatory Visit
Admission: RE | Admit: 2017-07-12 | Discharge: 2017-07-12 | Disposition: A | Discharge status: Home / Self Care | Payer: BLUE CROSS/BLUE SHIELD | Source: Ambulatory Visit | Attending: Physical Medicine and Rehabilitation | Admitting: Physical Medicine and Rehabilitation

## 2017-07-12 DIAGNOSIS — M5416 Radiculopathy, lumbar region: Secondary | ICD-10-CM

## 2017-07-12 DIAGNOSIS — M8938 Hypertrophy of bone, other site: Secondary | ICD-10-CM | POA: Diagnosis not present

## 2017-07-26 ENCOUNTER — Other Ambulatory Visit: Payer: Self-pay | Admitting: Orthopedic Surgery

## 2017-07-26 DIAGNOSIS — G8929 Other chronic pain: Secondary | ICD-10-CM

## 2017-07-26 DIAGNOSIS — M25532 Pain in left wrist: Principal | ICD-10-CM

## 2017-07-29 ENCOUNTER — Ambulatory Visit
Admission: RE | Admit: 2017-07-29 | Discharge: 2017-07-29 | Disposition: A | Discharge status: Home / Self Care | Payer: BLUE CROSS/BLUE SHIELD | Source: Ambulatory Visit | Attending: Orthopedic Surgery | Admitting: Orthopedic Surgery

## 2017-07-29 DIAGNOSIS — M67432 Ganglion, left wrist: Secondary | ICD-10-CM | POA: Insufficient documentation

## 2017-07-29 DIAGNOSIS — M25532 Pain in left wrist: Secondary | ICD-10-CM | POA: Insufficient documentation

## 2017-07-29 DIAGNOSIS — G8929 Other chronic pain: Secondary | ICD-10-CM | POA: Diagnosis not present

## 2017-07-29 DIAGNOSIS — M25332 Other instability, left wrist: Secondary | ICD-10-CM | POA: Insufficient documentation

## 2017-07-29 DIAGNOSIS — S52612A Displaced fracture of left ulna styloid process, initial encounter for closed fracture: Secondary | ICD-10-CM | POA: Insufficient documentation

## 2017-07-29 DIAGNOSIS — X58XXXA Exposure to other specified factors, initial encounter: Secondary | ICD-10-CM | POA: Diagnosis not present

## 2017-09-11 ENCOUNTER — Other Ambulatory Visit: Payer: Self-pay | Admitting: Family Medicine

## 2017-09-11 DIAGNOSIS — Z803 Family history of malignant neoplasm of breast: Secondary | ICD-10-CM

## 2017-09-11 DIAGNOSIS — N6459 Other signs and symptoms in breast: Secondary | ICD-10-CM

## 2017-09-17 ENCOUNTER — Ambulatory Visit
Admission: RE | Admit: 2017-09-17 | Discharge: 2017-09-17 | Disposition: A | Discharge status: Home / Self Care | Payer: BLUE CROSS/BLUE SHIELD | Source: Ambulatory Visit | Attending: Family Medicine | Admitting: Family Medicine

## 2017-09-17 DIAGNOSIS — Z803 Family history of malignant neoplasm of breast: Secondary | ICD-10-CM

## 2017-09-17 DIAGNOSIS — N6459 Other signs and symptoms in breast: Secondary | ICD-10-CM

## 2018-01-02 ENCOUNTER — Ambulatory Visit
Admission: EM | Admit: 2018-01-02 | Discharge: 2018-01-02 | Disposition: A | Discharge status: Home / Self Care | Payer: BLUE CROSS/BLUE SHIELD | Attending: Internal Medicine | Admitting: Internal Medicine

## 2018-01-02 ENCOUNTER — Encounter: Payer: Self-pay | Admitting: Gynecology

## 2018-01-02 DIAGNOSIS — J029 Acute pharyngitis, unspecified: Secondary | ICD-10-CM

## 2018-01-02 DIAGNOSIS — B9789 Other viral agents as the cause of diseases classified elsewhere: Secondary | ICD-10-CM

## 2018-01-02 LAB — RAPID STREP SCREEN (MED CTR MEBANE ONLY): Streptococcus, Group A Screen (Direct): NEGATIVE

## 2018-01-02 NOTE — Discharge Instructions (Signed)
Your strep test was negative. No antibiotics indicated at this time. Throat cultures are pending and we will notify you of the results when they become available. OTC analgesics, decongestants and salt water gargles recommended. May take tylenol and/or ibuprofen as needed. Follow up with primary care provider as needed

## 2018-01-02 NOTE — ED Triage Notes (Signed)
Patient c/o sore throat / nasal congestion x 3-4 days.

## 2018-01-02 NOTE — ED Provider Notes (Signed)
[facility]    CSN: [id number] Arrival date & time: 01/02/18  1732     History   Chief Complaint Chief Complaint  Patient presents with  . Sore Throat    HPI Tanya Crawford is a 37 y.o. female.   Subjective:   History was provided by the patient.  Tanya Crawford is a 37 y.o. female who presents for evaluation of a sore throat. Associated symptoms include sore throat and swollen glands. Onset of symptoms was 3 days ago and has been stable since that time.  She is drinking plenty of fluids. She has not had recent close exposure to someone with proven streptococcal pharyngitis.  The following portions of the patient's history were reviewed and updated as appropriate: allergies, current medications, past family history, past medical history, past social history, past surgical history and problem list.       Past Medical History:  Diagnosis Date  . Anemia    H/O   . Headache    MIGRAINES-RARE  . Lower back pain 10/16/2016   taking flexeril and hydrocodone  . 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 Pain in limb 09/17/2016  . Mass of left lower leg 09/17/2016  . Varicose veins of both lower extremities 09/17/2016    Past Surgical History:  Procedure Laterality Date  . ABDOMINAL HYSTERECTOMY    . CESAREAN SECTION    . CHOLECYSTECTOMY  10/23/2016   Procedure: LAPAROSCOPIC CHOLECYSTECTOMY;  Surgeon: Leonie Green, MD;  Location: [facility] ORS;  Service: General;;  . LIPOMA EXCISION Left 10/23/2016   Procedure: EXCISION LIPOMA THIGH;  Surgeon: Leonie Green, MD;  Location: [facility] ORS;  Service: General;  Laterality: Left;  . TONSILLECTOMY      OB History   None      Home Medications    Prior to Admission medications   Medication Sig Start Date End Date Taking? Authorizing Provider  diazepam (VALIUM) 10 MG tablet Take 10 mg by mouth daily as needed for anxiety.    Yes [provider]  HYDROcodone-acetaminophen (NORCO) 5-325 MG tablet Take 1-2 tablets by mouth every 4 (four) hours as needed for moderate pain. 10/23/16  Yes Leonie Green, MD  Multiple Vitamins-Minerals [facility] SKIN AND NAILS FORMULA PO) Take 3 tablets by mouth daily.   Yes [provider]  DULoxetine (CYMBALTA) 20 MG capsule Take 40 mg by mouth at bedtime.  07/12/16 01/08/17  [provider]  HYDROcodone-acetaminophen (NORCO/VICODIN) 5-325 MG tablet 1-2 tabs po bid prn 10/16/16   Norval Gable, MD    Family History Family History  Problem Relation Age of Onset  . Cancer Mother   . Stroke Mother   . Varicose Veins Mother   . Breast cancer Mother 62  . Cancer Father   . Congestive Heart Failure Father   . Cancer Other   . Breast cancer Other 34       great aunt  . Hyperlipidemia Maternal Grandmother   . Breast cancer Maternal Grandmother 60  . Heart attack Maternal Grandfather   . Congestive Heart Failure Paternal Grandfather     Social History Social History   Tobacco Use  . Smoking status: Current Every Day Smoker    Packs/day: 0.50    Years: 15.00    Pack years: 7.50    Types: Cigarettes  . Smokeless tobacco: Never Used  Substance Use Topics  . Alcohol use:  No  . Drug use: No     Allergies   Latex   Review of Systems Review of Systems  Constitutional: Negative for fever.  HENT: Positive for sore throat. Negative for congestion, ear pain, rhinorrhea, trouble swallowing and voice change.   Respiratory: Negative for cough and shortness of breath.   All other systems reviewed and are negative.    Physical Exam Triage Vital Signs ED Triage Vitals  Enc Vitals Group     BP 01/02/18 1755 (!) 117/92     Pulse Rate 01/02/18 1755 92     Resp 01/02/18 1755 16     Temp 01/02/18 1755 98.6 F (37 C)     Temp Source 01/02/18 1755 Oral     SpO2 01/02/18 1755 100 %     Weight 01/02/18 1754 150 lb (68 kg)     Height --      Head Circumference --       Peak Flow --      Pain Score 01/02/18 1754 7     Pain Loc --      Pain Edu? --      Excl. in [place]? --    No data found.  Updated Vital Signs BP (!) 117/92 (BP Location: Left Arm)   Pulse 92   Temp 98.6 F (37 C) (Oral)   Resp 16   Wt 150 lb (68 kg)   SpO2 100%   BMI 24.96 kg/m   Visual Acuity Right Eye Distance:   Left Eye Distance:   Bilateral Distance:    Right Eye Near:   Left Eye Near:    Bilateral Near:     Physical Exam  Constitutional: She is oriented to person, place, and time. She appears well-developed and well-nourished.  Non-toxic appearance. She does not appear ill.  HENT:  Head: Normocephalic.  Right Ear: Hearing, tympanic membrane and ear canal normal.  Left Ear: Hearing, tympanic membrane and ear canal normal.  Mouth/Throat: Uvula is midline, oropharynx is clear and moist and mucous membranes are normal. No oral lesions. No uvula swelling. No oropharyngeal exudate, posterior oropharyngeal edema, posterior oropharyngeal erythema or tonsillar abscesses. No tonsillar exudate.  Eyes: Pupils are equal, round, and reactive to light. EOM are normal.  Neck: Normal range of motion. Neck supple.  Cardiovascular: Normal rate and regular rhythm.  Pulmonary/Chest: Effort normal and breath sounds normal.  Lymphadenopathy:    She has no cervical adenopathy.  Neurological: She is alert and oriented to person, place, and time.  Skin: Skin is warm and dry.  Psychiatric: She has a normal mood and affect. Her behavior is normal.     UC Treatments / Results  Labs (all labs ordered are listed, but only abnormal results are displayed) Labs Reviewed  RAPID STREP SCREEN ([facility] ONLY)  CULTURE, GROUP A STREP [facility])    EKG None  Radiology No results found.  Procedures Procedures (including critical care time)  Medications Ordered in UC Medications - No data to display  Initial Impression / Assessment and Plan / UC Course  I have reviewed the triage vital  signs and the nursing notes.  Pertinent labs & imaging results that were available during my care of the patient were reviewed by me and considered in my medical decision making (see chart for details).     37 year old female with a 3-day history of sore throat and swollen glands.  No fevers.  No other symptoms.  Rapid strep negative.  Vital signs stable.  Afebrile.  Nontoxic-appearing.   Plan: 1. Throat cultures pending  2. Use of OTC analgesics recommended as well as salt water gargles. 3. Use of decongestant recommended. 4. Follow up as needed.   Today's evaluation has revealed no signs of a dangerous process. Discussed diagnosis with patient. Patient aware of their diagnosis, possible red flag symptoms to watch out for and need for close follow up. Patient understands verbal and written discharge instructions. Patient comfortable with plan and disposition.  Patient has a clear mental status at this time, good insight into illness (after discussion and teaching) and has clear judgment to make decisions regarding their care.  Documentation was completed with the aid of voice recognition software. Transcription may contain typographical errors.  Final Clinical Impressions(s) / UC Diagnoses   Final diagnoses:  Viral pharyngitis     Discharge Instructions     Your strep test was negative. No antibiotics indicated at this time. Throat cultures are pending and we will notify you of the results when they become available. OTC analgesics, decongestants and salt water gargles recommended. May take tylenol and/or ibuprofen as needed. Follow up with primary care provider as needed     ED Prescriptions    None     Controlled Substance Prescriptions [place] Controlled Substance Registry consulted? Not Applicable   Enrique Sack, [place] 01/02/18 1840

## 2018-01-05 LAB — CULTURE, GROUP A STREP (THRC)

## 2018-03-20 ENCOUNTER — Other Ambulatory Visit: Payer: Self-pay | Admitting: General Surgery

## 2018-03-20 DIAGNOSIS — M79652 Pain in left thigh: Secondary | ICD-10-CM

## 2018-03-25 ENCOUNTER — Ambulatory Visit
Admission: RE | Admit: 2018-03-25 | Discharge: 2018-03-25 | Disposition: A | Discharge status: Home / Self Care | Payer: BLUE CROSS/BLUE SHIELD | Source: Ambulatory Visit | Attending: General Surgery | Admitting: General Surgery

## 2018-03-25 DIAGNOSIS — R2242 Localized swelling, mass and lump, left lower limb: Secondary | ICD-10-CM | POA: Diagnosis not present

## 2018-03-25 DIAGNOSIS — M79652 Pain in left thigh: Secondary | ICD-10-CM | POA: Insufficient documentation

## 2018-03-25 IMAGING — CR DG CHEST 2V
2 series · 2 of 2 positions shown · non-contrast
Comparison: April 13, 2013

CLINICAL DATA: Cough and wheezing for 10 days

EXAM:
CHEST  2 VIEW

[chest pa]
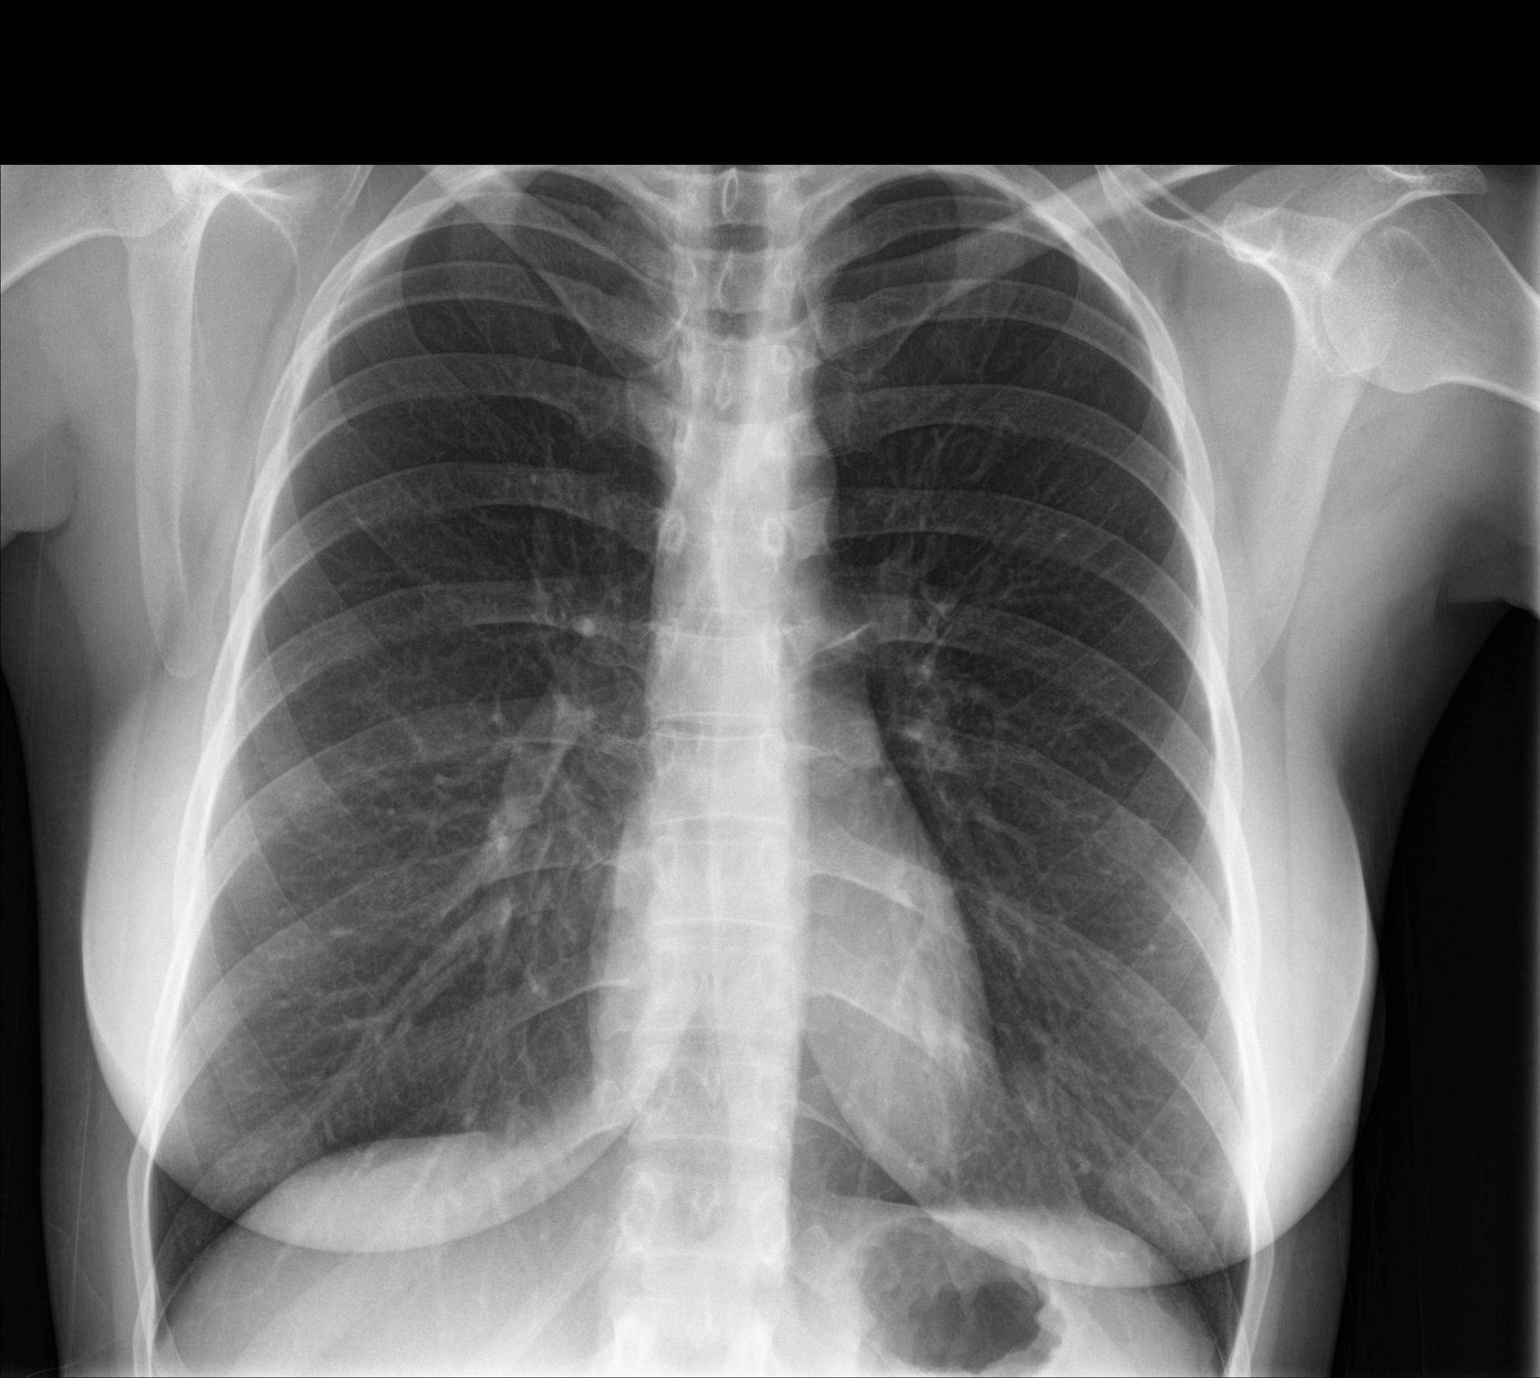

[chest lat]
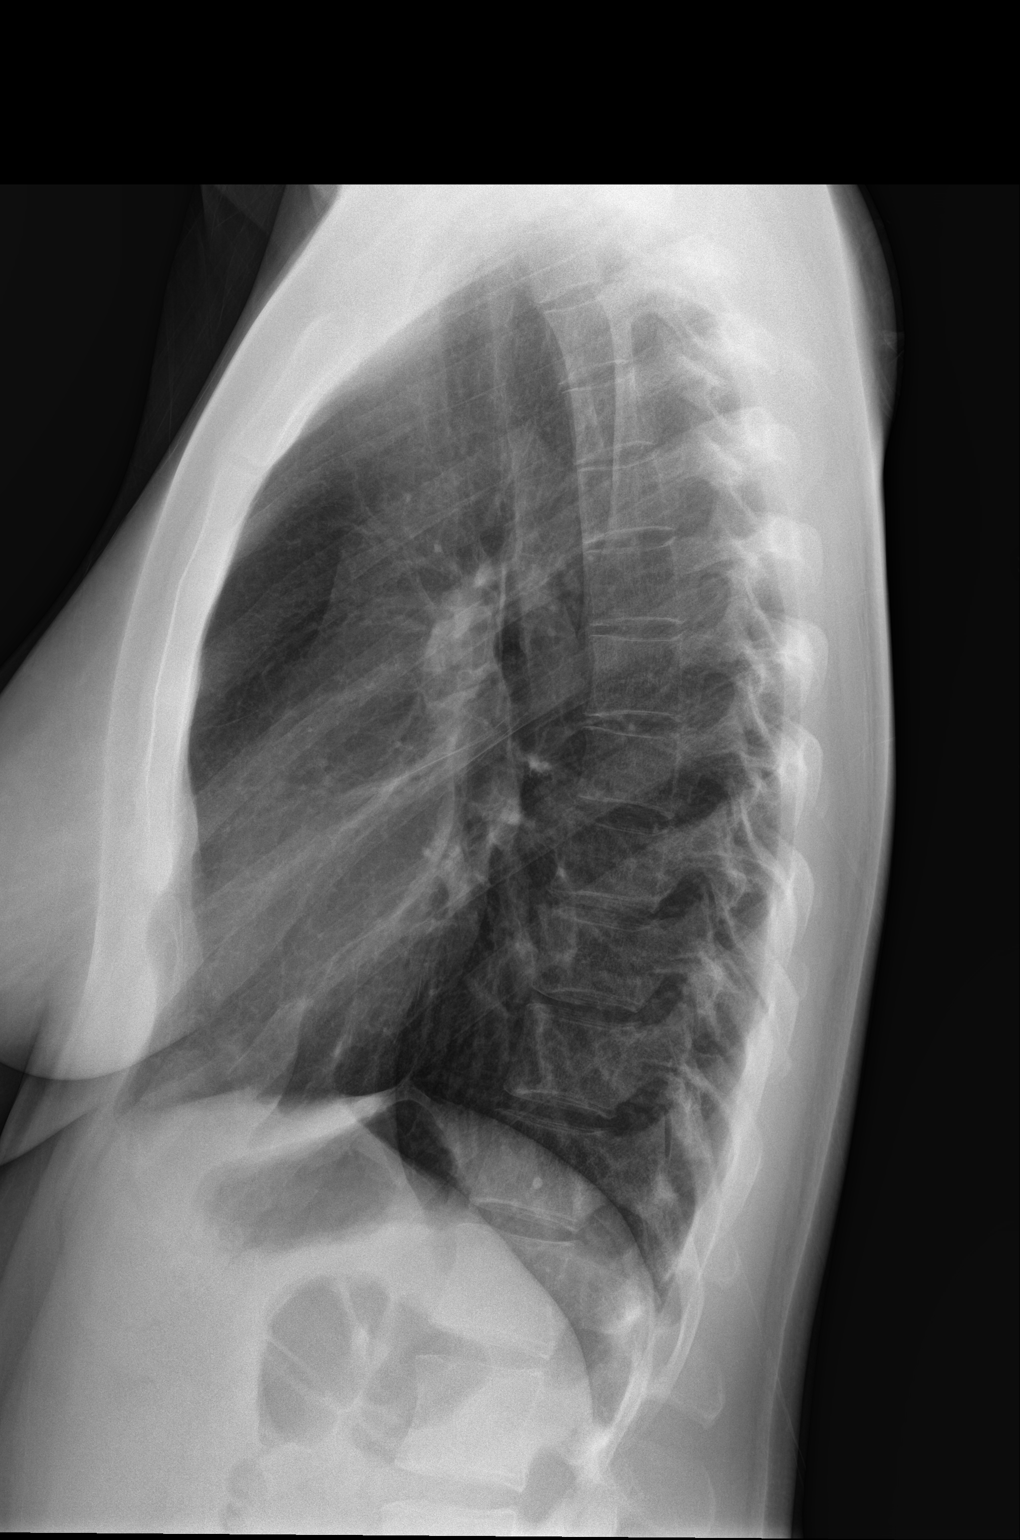

[2 of 2 positions shown; findings below may reference images not displayed]

FINDINGS: Lungs are clear. Heart size and pulmonary vascularity are normal. No
adenopathy. No bone lesions.
IMPRESSION: No edema or consolidation.

## 2018-04-29 ENCOUNTER — Ambulatory Visit
Admission: RE | Admit: 2018-04-29 | Discharge: 2018-04-29 | Disposition: A | Discharge status: Home / Self Care | Payer: BLUE CROSS/BLUE SHIELD | Source: Ambulatory Visit | Attending: Family Medicine | Admitting: Family Medicine

## 2018-04-29 ENCOUNTER — Other Ambulatory Visit: Payer: Self-pay | Admitting: Family Medicine

## 2018-04-29 DIAGNOSIS — R05 Cough: Secondary | ICD-10-CM | POA: Insufficient documentation

## 2018-04-29 DIAGNOSIS — R059 Cough, unspecified: Secondary | ICD-10-CM

## 2018-12-06 IMAGING — CR DG SMALL BOWEL
2 series · 10 of 10 positions shown · non-contrast
Comparison: CT abdomen pelvis 05/25/2016

CLINICAL DATA: Right upper quadrant pain

EXAM:
SMALL BOWEL SERIES
TECHNIQUE: Following ingestion of thin barium, serial small bowel images were
obtained including spot views of the terminal ileum.
FLUOROSCOPY TIME:  Fluoroscopy Time:  0 minutes 48 seconds
Radiation Exposure Index (if provided by the fluoroscopic device):
Number of Acquired Spot Images: 0

[Series 1: t abdomen supine · 0.14mm/px · 4 of 4 slices shown]
[im 1/4]
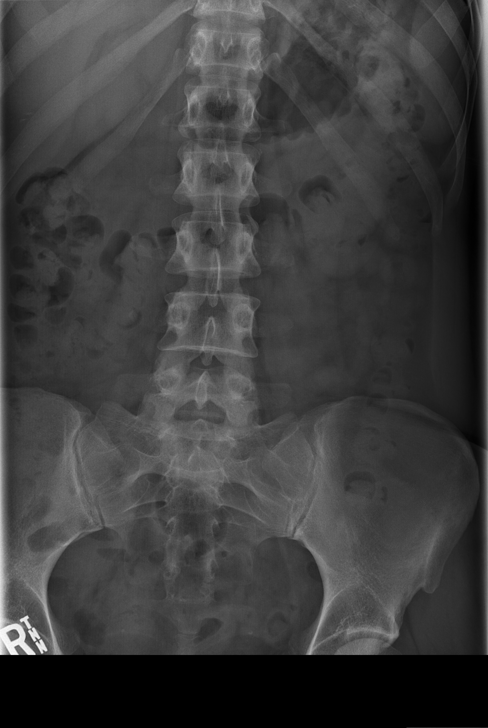
[im 2/4]
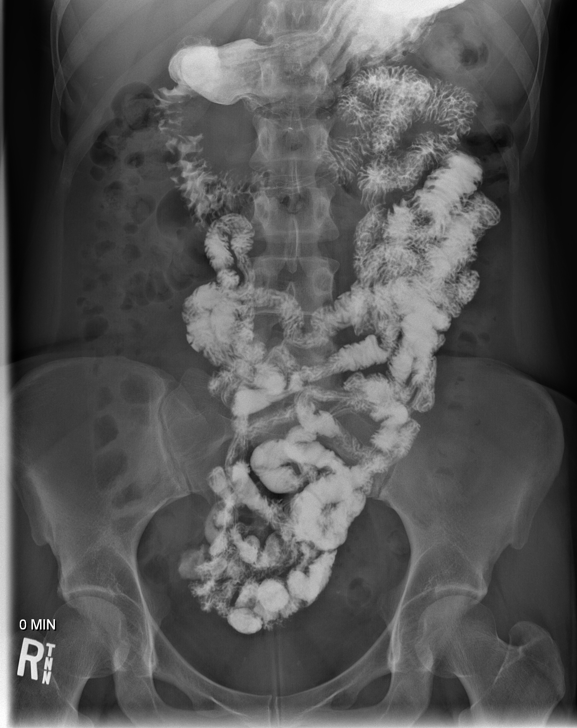
[im 3/4]
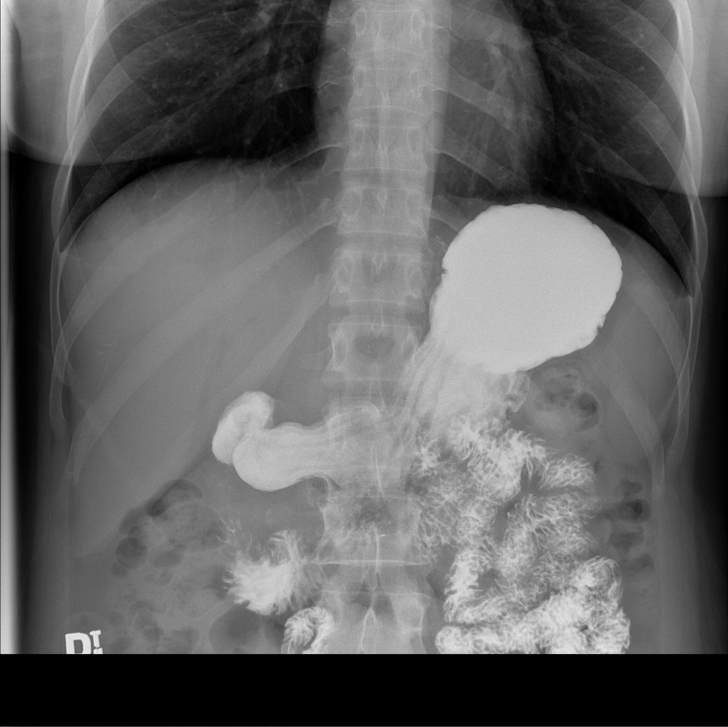
[im 4/4]
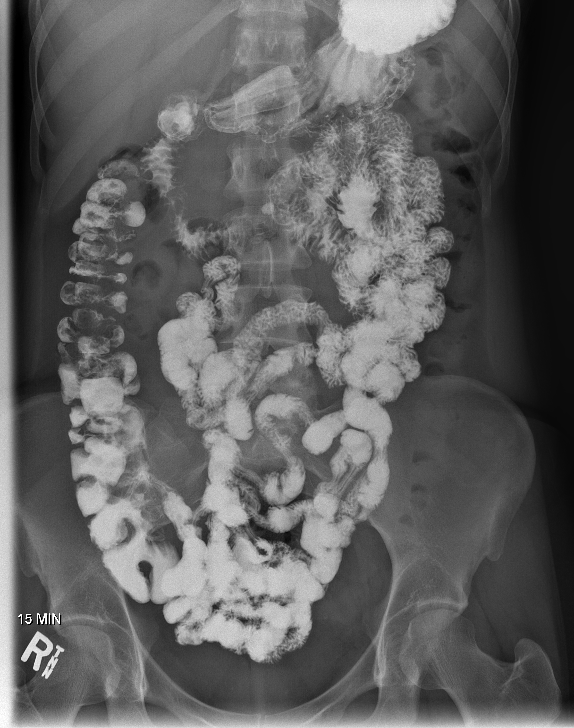

[Series 5: fluoro_barium singleshot_bw · 0.20mm/px · 6 of 6 slices shown]
[im 1/6]
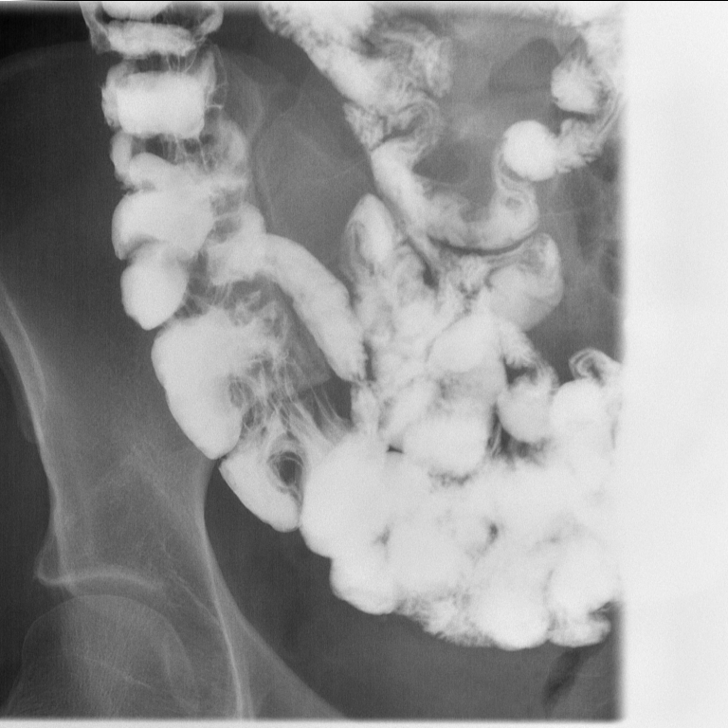
[im 2/6]
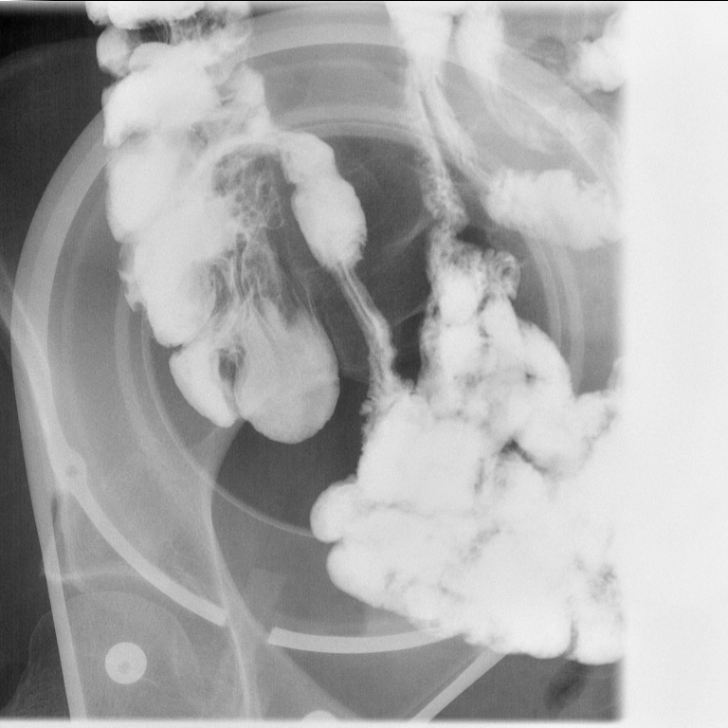
[im 3/6]
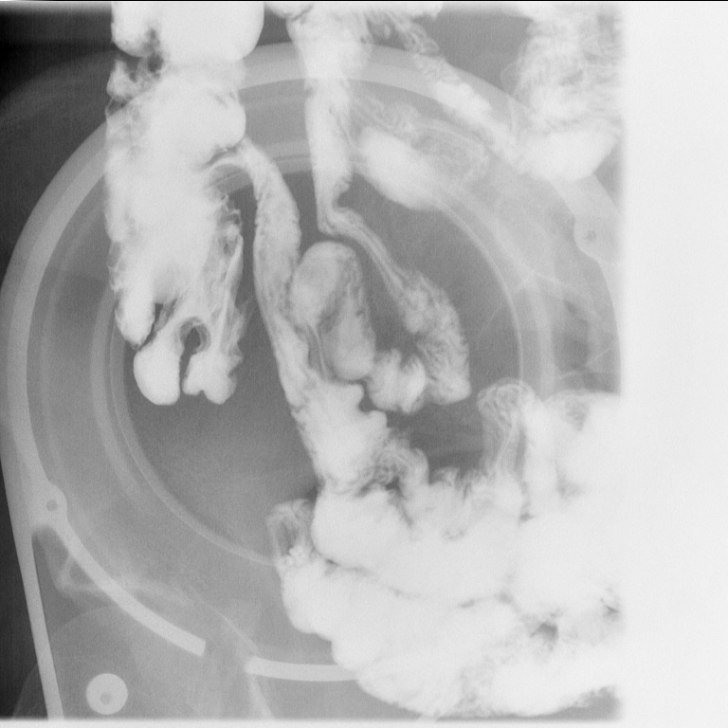
[im 4/6]
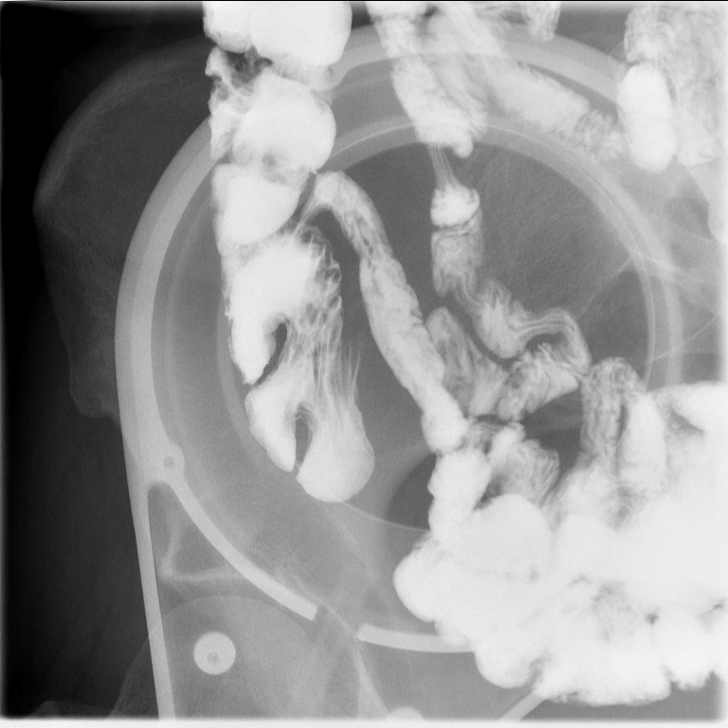
[im 5/6]
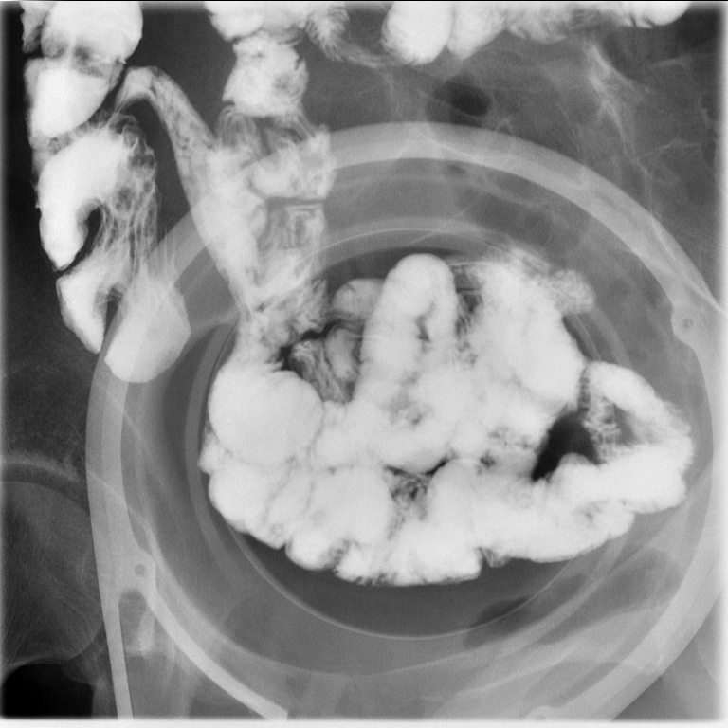
[im 6/6]
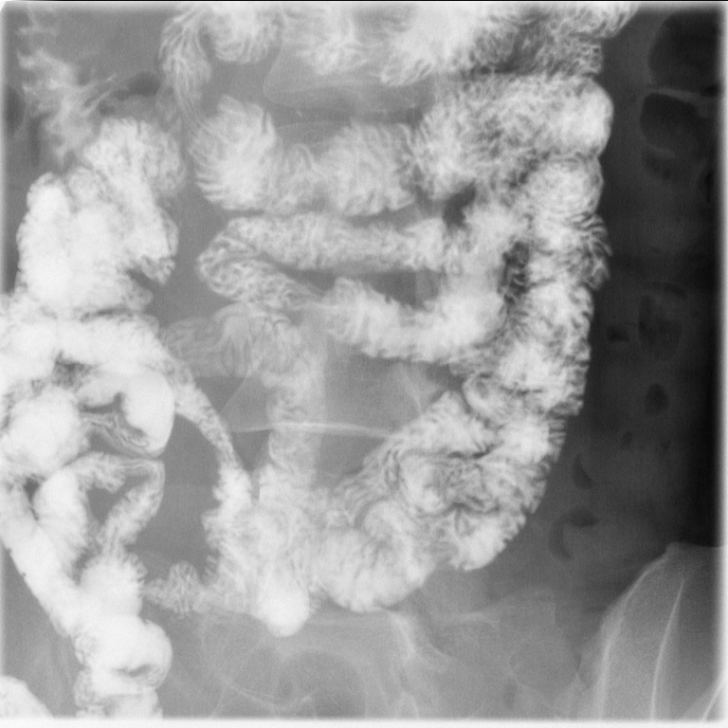

[10 of 10 positions shown; findings below may reference images not displayed]

FINDINGS: Rapid small bowel transit time. Barium reaches the hepatic flexure
at 15 minutes. Negative for bowel obstruction. Small bowel mucosa is
normal without thickening. No small bowel stricture or mass or
diverticulum. Terminal ileum appears normal. Negative for Crohn's
disease. Right colon also appears normal.

Preliminary KUB negative
IMPRESSION: Rapid small-bowel transit time.

Negative for Crohn's disease.

## 2019-01-05 ENCOUNTER — Other Ambulatory Visit: Payer: Self-pay | Admitting: Otolaryngology

## 2019-01-05 DIAGNOSIS — D1039 Benign neoplasm of other parts of mouth: Secondary | ICD-10-CM

## 2019-01-12 ENCOUNTER — Other Ambulatory Visit: Payer: Self-pay

## 2019-01-12 ENCOUNTER — Ambulatory Visit
Admission: RE | Admit: 2019-01-12 | Discharge: 2019-01-12 | Disposition: A | Discharge status: Home / Self Care | Payer: BC Managed Care – PPO | Source: Ambulatory Visit | Attending: Otolaryngology | Admitting: Otolaryngology

## 2019-01-12 DIAGNOSIS — D1039 Benign neoplasm of other parts of mouth: Secondary | ICD-10-CM | POA: Diagnosis present

## 2019-01-12 MED ORDER — IOHEXOL 300 MG/ML  SOLN
75.0000 mL | Freq: Once | INTRAMUSCULAR | Status: AC | PRN
  Administered 2019-01-12: 75 mL via INTRAVENOUS

## 2019-01-15 ENCOUNTER — Ambulatory Visit: Payer: Self-pay | Admitting: Otolaryngology

## 2019-01-16 NOTE — Discharge Instructions (Signed)
General Anesthesia, Adult, Care After  This sheet gives you information about how to care for yourself after your procedure. Your health care provider may also give you more specific instructions. If you have problems or questions, contact your health care provider.  What can I expect after the procedure?  After the procedure, the following side effects are common:  Pain or discomfort at the IV site.  Nausea.  Vomiting.  Sore throat.  Trouble concentrating.  Feeling cold or chills.  Weak or tired.  Sleepiness and fatigue.  Soreness and body aches. These side effects can affect parts of the body that were not involved in surgery.  Follow these instructions at home:    For at least 24 hours after the procedure:  Have a responsible adult stay with you. It is important to have someone help care for you until you are awake and alert.  Rest as needed.  Do not:  Participate in activities in which you could fall or become injured.  Drive.  Use heavy machinery.  Drink alcohol.  Take sleeping pills or medicines that cause drowsiness.  Make important decisions or sign legal documents.  Take care of children on your own.  Eating and drinking  Follow any instructions from your health care provider about eating or drinking restrictions.  When you feel hungry, start by eating small amounts of foods that are soft and easy to digest (bland), such as toast. Gradually return to your regular diet.  Drink enough fluid to keep your urine pale yellow.  If you vomit, rehydrate by drinking water, juice, or clear broth.  General instructions  If you have sleep apnea, surgery and certain medicines can increase your risk for breathing problems. Follow instructions from your health care provider about wearing your sleep device:  Anytime you are sleeping, including during daytime naps.  While taking prescription pain medicines, sleeping medicines, or medicines that make you drowsy.  Return to your normal activities as told by your health care  provider. Ask your health care provider what activities are safe for you.  Take over-the-counter and prescription medicines only as told by your health care provider.  If you smoke, do not smoke without supervision.  Keep all follow-up visits as told by your health care provider. This is important.  Contact a health care provider if:  You have nausea or vomiting that does not get better with medicine.  You cannot eat or drink without vomiting.  You have pain that does not get better with medicine.  You are unable to pass urine.  You develop a skin rash.  You have a fever.  You have redness around your IV site that gets worse.  Get help right away if:  You have difficulty breathing.  You have chest pain.  You have blood in your urine or stool, or you vomit blood.  Summary  After the procedure, it is common to have a sore throat or nausea. It is also common to feel tired.  Have a responsible adult stay with you for the first 24 hours after general anesthesia. It is important to have someone help care for you until you are awake and alert.  When you feel hungry, start by eating small amounts of foods that are soft and easy to digest (bland), such as toast. Gradually return to your regular diet.  Drink enough fluid to keep your urine pale yellow.  Return to your normal activities as told by your health care provider. Ask your health care   provider what activities are safe for you.  This information is not intended to replace advice given to you by your health care provider. Make sure you discuss any questions you have with your health care provider.  Document Released: 10/29/2000 Document Revised: 03/08/2017 Document Reviewed: 03/08/2017  Elsevier Interactive Patient Education  2019 [employer]

## 2019-01-19 ENCOUNTER — Encounter: Payer: Self-pay | Admitting: *Deleted

## 2019-01-19 ENCOUNTER — Other Ambulatory Visit: Payer: Self-pay

## 2019-01-20 ENCOUNTER — Other Ambulatory Visit
Admission: RE | Admit: 2019-01-20 | Discharge: 2019-01-20 | Disposition: A | Discharge status: Home / Self Care | Payer: BC Managed Care – PPO | Source: Ambulatory Visit | Attending: Otolaryngology | Admitting: Otolaryngology

## 2019-01-20 DIAGNOSIS — Z1159 Encounter for screening for other viral diseases: Secondary | ICD-10-CM | POA: Insufficient documentation

## 2019-01-21 LAB — NOVEL CORONAVIRUS, NAA (HOSP ORDER, SEND-OUT TO REF LAB; TAT 18-24 HRS): SARS-CoV-2, NAA: NOT DETECTED

## 2019-01-21 NOTE — Anesthesia Preprocedure Evaluation (Addendum)
Anesthesia Evaluation  Patient identified by MRN, date of birth, ID band Patient awake    Reviewed: Allergy & Precautions, NPO status , Patient's Chart, lab work & pertinent test results  History of Anesthesia Complications Negative for: history of anesthetic complications  Airway Mallampati: III   Neck ROM: Full    Dental no notable dental hx.    Pulmonary Current Smoker (1 ppd),    Pulmonary exam normal breath sounds clear to auscultation       Cardiovascular Exercise Tolerance: Good Normal cardiovascular exam Rhythm:Regular Rate:Normal  Hx myocarditis 2010   Neuro/Psych  Headaches,  Neuromuscular disease (neuropathy feet)    GI/Hepatic GERD  ,  Endo/Other  negative endocrine ROS  Renal/GU negative Renal ROS     Musculoskeletal  (+) Arthritis ,   Abdominal   Peds  Hematology  (+) Blood dyscrasia, anemia ,   Anesthesia Other Findings Sjogren syndrome  Reproductive/Obstetrics                            Anesthesia Physical Anesthesia Plan  ASA: II  Anesthesia Plan: General   Post-op Pain Management:    Induction: Intravenous  PONV Risk Score and Plan: 2 and Dexamethasone and Ondansetron  Airway Management Planned: Oral ETT  Additional Equipment:   Intra-op Plan:   Post-operative Plan: Extubation in OR  Informed Consent: I have reviewed the patients History and Physical, chart, labs and discussed the procedure including the risks, benefits and alternatives for the proposed anesthesia with the patient or authorized representative who has indicated his/her understanding and acceptance.       Plan Discussed with: CRNA  Anesthesia Plan Comments:        Anesthesia Quick Evaluation

## 2019-01-23 ENCOUNTER — Ambulatory Visit
Admission: RE | Admit: 2019-01-23 | Discharge: 2019-01-23 | Disposition: A | Discharge status: Home / Self Care | Payer: BC Managed Care – PPO | Source: Ambulatory Visit | Attending: Otolaryngology | Admitting: Otolaryngology

## 2019-01-23 ENCOUNTER — Encounter
Admission: RE | Disposition: A | Discharge status: Home / Self Care | Payer: Self-pay | Source: Ambulatory Visit | Attending: Otolaryngology

## 2019-01-23 ENCOUNTER — Ambulatory Visit: Payer: BC Managed Care – PPO | Admitting: Anesthesiology

## 2019-01-23 DIAGNOSIS — F17210 Nicotine dependence, cigarettes, uncomplicated: Secondary | ICD-10-CM | POA: Insufficient documentation

## 2019-01-23 DIAGNOSIS — G629 Polyneuropathy, unspecified: Secondary | ICD-10-CM | POA: Insufficient documentation

## 2019-01-23 DIAGNOSIS — K068 Other specified disorders of gingiva and edentulous alveolar ridge: Secondary | ICD-10-CM | POA: Diagnosis not present

## 2019-01-23 HISTORY — DX: Unspecified mononeuropathy of bilateral lower limbs: G57.93

## 2019-01-23 HISTORY — DX: Unspecified osteoarthritis, unspecified site: M19.90

## 2019-01-23 HISTORY — DX: Sjogren syndrome, unspecified: M35.00

## 2019-01-23 HISTORY — DX: Emphysema, unspecified: J43.9

## 2019-01-23 HISTORY — PX: MASS EXCISION: SHX2000

## 2019-01-23 SURGERY — EXCISION MASS
Anesthesia: General | Site: Mouth

## 2019-01-23 MED ORDER — SUCCINYLCHOLINE CHLORIDE 20 MG/ML IJ SOLN
INTRAMUSCULAR | Status: DC | PRN
  Administered 2019-01-23: 100 mg via INTRAVENOUS

## 2019-01-23 MED ORDER — LIDOCAINE HCL (CARDIAC) PF 100 MG/5ML IV SOSY
PREFILLED_SYRINGE | INTRAVENOUS | Status: DC | PRN
  Administered 2019-01-23: 40 mg via INTRAVENOUS

## 2019-01-23 MED ORDER — MIDAZOLAM HCL 5 MG/5ML IJ SOLN
INTRAMUSCULAR | Status: DC | PRN
  Administered 2019-01-23: 2 mg via INTRAVENOUS

## 2019-01-23 MED ORDER — DEXAMETHASONE SODIUM PHOSPHATE 4 MG/ML IJ SOLN
INTRAMUSCULAR | Status: DC | PRN
  Administered 2019-01-23: 8 mg via INTRAVENOUS

## 2019-01-23 MED ORDER — OXYCODONE HCL 5 MG PO TABS: 5.0000 mg | ORAL_TABLET | Freq: Once | ORAL | Status: AC | PRN

## 2019-01-23 MED ORDER — PROPOFOL 10 MG/ML IV BOLUS
INTRAVENOUS | Status: DC | PRN
  Administered 2019-01-23: 30 mg via INTRAVENOUS
  Administered 2019-01-23: 150 mg via INTRAVENOUS

## 2019-01-23 MED ORDER — FENTANYL CITRATE (PF) 100 MCG/2ML IJ SOLN: 25.0000 ug | INTRAMUSCULAR | Status: DC | PRN

## 2019-01-23 MED ORDER — LACTATED RINGERS IV SOLN
INTRAVENOUS | Status: DC
  Administered 2019-01-23: 07:00:00 via INTRAVENOUS

## 2019-01-23 MED ORDER — HYDROCODONE-ACETAMINOPHEN 7.5-325 MG/15ML PO SOLN: 15.0000 mL | Freq: Four times a day (QID) | ORAL | 0 refills | Status: AC | PRN

## 2019-01-23 MED ORDER — FENTANYL CITRATE (PF) 100 MCG/2ML IJ SOLN
INTRAMUSCULAR | Status: DC | PRN
  Administered 2019-01-23: 50 ug via INTRAVENOUS
  Administered 2019-01-23 (×2): 25 ug via INTRAVENOUS

## 2019-01-23 MED ORDER — ONDANSETRON HCL 4 MG/2ML IJ SOLN
INTRAMUSCULAR | Status: DC | PRN
  Administered 2019-01-23: 4 mg via INTRAVENOUS

## 2019-01-23 MED ORDER — ONDANSETRON HCL 4 MG/2ML IJ SOLN: 4.0000 mg | Freq: Once | INTRAMUSCULAR | Status: DC | PRN

## 2019-01-23 MED ORDER — AMOXICILLIN 400 MG/5ML PO SUSR: 800.0000 mg | Freq: Two times a day (BID) | ORAL | 0 refills | Status: AC

## 2019-01-23 MED ORDER — LIDOCAINE-EPINEPHRINE 1 %-1:100000 IJ SOLN
INTRAMUSCULAR | Status: DC | PRN
  Administered 2019-01-23: 2 mL

## 2019-01-23 MED ORDER — OXYCODONE HCL 5 MG/5ML PO SOLN
5.0000 mg | Freq: Once | ORAL | Status: AC | PRN
  Administered 2019-01-23: 09:00:00 5 mg via ORAL

## 2019-01-23 MED ORDER — ACETAMINOPHEN 10 MG/ML IV SOLN: 1000.0000 mg | Freq: Once | INTRAVENOUS | Status: DC | PRN

## 2019-01-23 SURGICAL SUPPLY — 19 items
DRAPE HEAD BAR (DRAPES) ×3 IMPLANT
ELECT CAUTERY BLADE TIP 2.5 (TIP) ×3
ELECT REM PT RETURN 9FT ADLT (ELECTROSURGICAL) ×3
ELECTRODE CAUTERY BLDE TIP 2.5 (TIP) IMPLANT
ELECTRODE REM PT RTRN 9FT ADLT (ELECTROSURGICAL) ×1 IMPLANT
GLOVE PI ULTRA LF STRL 7.5 (GLOVE) ×2 IMPLANT
GLOVE PI ULTRA NON LATEX 7.5 (GLOVE) ×4
GOWN STRL REUS W/ TWL LRG LVL3 (GOWN DISPOSABLE) ×1 IMPLANT
GOWN STRL REUS W/TWL LRG LVL3 (GOWN DISPOSABLE) ×2
KIT TURNOVER KIT A (KITS) ×3 IMPLANT
NDL HYPO 27GX1-1/4 (NEEDLE) IMPLANT
NEEDLE HYPO 27GX1-1/4 (NEEDLE) ×3 IMPLANT
NS IRRIG 500ML POUR BTL (IV SOLUTION) ×3 IMPLANT
PACK ENT CUSTOM (PACKS) ×3 IMPLANT
PENCIL SMOKE EVACUATOR (MISCELLANEOUS) ×3 IMPLANT
STRAP BODY AND KNEE 60X3 (MISCELLANEOUS) ×3 IMPLANT
SUCTION FRAZIER TIP 10 FR DISP (SUCTIONS) ×2 IMPLANT
SUT CHROMIC 3 0 SH 27 (SUTURE) ×2 IMPLANT
SYR 10ML LL (SYRINGE) ×3 IMPLANT

## 2019-01-23 NOTE — H&P (Signed)
H&P has been reviewed and patient reevaluated, no changes necessary. To be downloaded later.  

## 2019-01-23 NOTE — Anesthesia Procedure Notes (Signed)
Procedure Name: Intubation Date/Time: 01/23/2019 8:14 AM Performed by: Janna Arch, CRNA Pre-anesthesia Checklist: Patient identified, Emergency Drugs available, Suction available, Patient being monitored and Timeout performed Patient Re-evaluated:Patient Re-evaluated prior to induction Oxygen Delivery Method: Circle system utilized Preoxygenation: Pre-oxygenation with 100% oxygen Induction Type: IV induction Ventilation: Mask ventilation without difficulty Laryngoscope Size: Mac and 3 Grade View: Grade I Tube type: Oral Rae Tube size: 7.0 mm Number of attempts: 1 Placement Confirmation: ETT inserted through vocal cords under direct vision,  positive ETCO2 and breath sounds checked- equal and bilateral Tube secured with: Tape Dental Injury: Teeth and Oropharynx as per pre-operative assessment

## 2019-01-23 NOTE — Transfer of Care (Signed)
Immediate Anesthesia Transfer of Care Note  Patient: Tanya Crawford  Procedure(s) Performed: EXCISION OF LEFT UPPER GUM/CHEEK CYST WITH CLOSURE (N/A Mouth)  Patient Location: PACU  Anesthesia Type: General  Level of Consciousness: awake, alert  and patient cooperative  Airway and Oxygen Therapy: Patient Spontanous Breathing and Patient connected to supplemental oxygen  Post-op Assessment: Post-op Vital signs reviewed, Patient's Cardiovascular Status Stable, Respiratory Function Stable, Patent Airway and No signs of Nausea or vomiting  Post-op Vital Signs: Reviewed and stable  Complications: No apparent anesthesia complications

## 2019-01-23 NOTE — Op Note (Signed)
01/23/2019  8:55 AM    Tanya Crawford  [id number]   Pre-Op Dx: Left upper gum/cheek cyst/lesion  Post-op Dx: Same  Proc: Excision left upper gum/cheek cyst/lesion with closure.  Surg:  Elon Alas Camellia Popescu  Anes:  GOT  EBL: 10 mL  Comp: None  Findings: Appeared to be granulation tissue with a little bit of pus in it, likely a tooth root abscess.  This was above tooth #12   Procedure: The patient was brought to the operating room placed in supine position.  She was given general anesthesia by oral endotracheal intubation.  Once patient was asleep her left cheek was elevated to visualize the area.  You could feel a firmness and swelling above the canine area underneath the upper gum and cheek.  2-1/2 mL of 1% Xylocaine with epi 1: 100,000 was used for infiltration of the soft tissues around this.  This was allowed to sit for about 5 minutes for vasoconstriction.  Electrocautery was used to incise the gum at the upper portion of the gingiva.  Once the mucosa was cut through some of the muscle was elevated over this and the periosteum was incised.  Underneath it appeared to be more granulation tissue and a drop of yellowish mucus was suctioned away.  This did not appear to be a cyst as much as it was granulation tissue under the periosteum.  The periosteum was cut around it as it was freed up.  An area of approximately 8 mm of periosteum were cut around to free this up.  There is some scarred muscular tissue on its superior border that was cut across with electrocautery to free this as well.  It appeared to be thickened inflamed tissue with some granulation that was deep to the periosteum.  Once it was completely removed you could see the defect of periosteum overlying the bone above the first premolar, tooth #12.  I did not see any obvious defects in the bone.  The bone was not eroded at all.  Wound was irrigated copiously and there is very minimal ooze anywhere.  This was controlled with  electrocautery.  The wound was then closed using 3-0 chromic suture in a running locking stitch.  This area was very dry and should not cause any significant bruising.  The patient tolerated the procedure well.  She was awakened and taken to the recovery room in satisfactory condition.  There were no operative complications.  Dispo:   To PACU to be discharged home  Plan: To follow-up in the office in 1 week for reevaluation of the wound and to go over the path report.  I will put her on antibiotic postop since this likely represents a tooth root abscess.  I have given her some hydrocodone as well that she can use for pain.  These will both be in liquid form since she cannot swallow pills very well.  Elon Alas [name]  01/23/2019 8:55 AM

## 2019-01-23 NOTE — Anesthesia Postprocedure Evaluation (Signed)
Anesthesia Post Note  Patient: Tanya Crawford  Procedure(s) Performed: EXCISION OF LEFT UPPER GUM/CHEEK CYST WITH CLOSURE (N/A Mouth)  Patient location during evaluation: PACU Anesthesia Type: General Level of consciousness: awake and alert, oriented and patient cooperative Pain management: pain level controlled Vital Signs Assessment: post-procedure vital signs reviewed and stable Respiratory status: spontaneous breathing, nonlabored ventilation and respiratory function stable Cardiovascular status: blood pressure returned to baseline and stable Postop Assessment: adequate PO intake Anesthetic complications: no    Darrin Nipper

## 2019-01-26 ENCOUNTER — Encounter: Payer: Self-pay | Admitting: Otolaryngology

## 2019-01-27 LAB — SURGICAL PATHOLOGY

## 2019-06-25 ENCOUNTER — Other Ambulatory Visit: Payer: Self-pay

## 2019-06-25 DIAGNOSIS — Z20822 Contact with and (suspected) exposure to covid-19: Secondary | ICD-10-CM

## 2019-06-28 LAB — NOVEL CORONAVIRUS, NAA: SARS-CoV-2, NAA: DETECTED — AB

## 2020-01-27 IMAGING — US US EXTREM LOW*L* LIMITED
1 series · 14 of 18 positions shown · non-contrast
Comparison: None.

CLINICAL DATA: Left thigh pain for the past month. History of prior
left thigh angiolipoma removal in October 2016.

EXAM:
ULTRASOUND LEFT LOWER EXTREMITY LIMITED
TECHNIQUE: Ultrasound examination of the lower extremity soft tissues was
performed in the area of clinical concern.

[Series 1: us extrem low*left* limited · 0.07mm/px · 18 acquisitions, 14 frames shown]
[im 1/18]
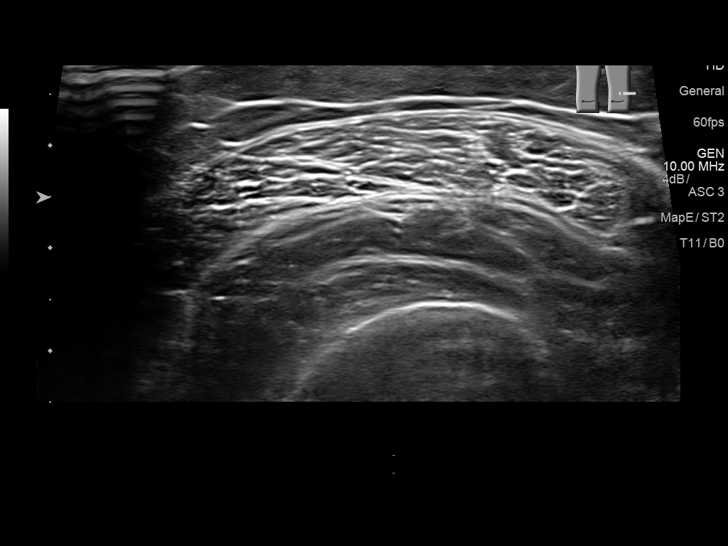
[im 2/18]
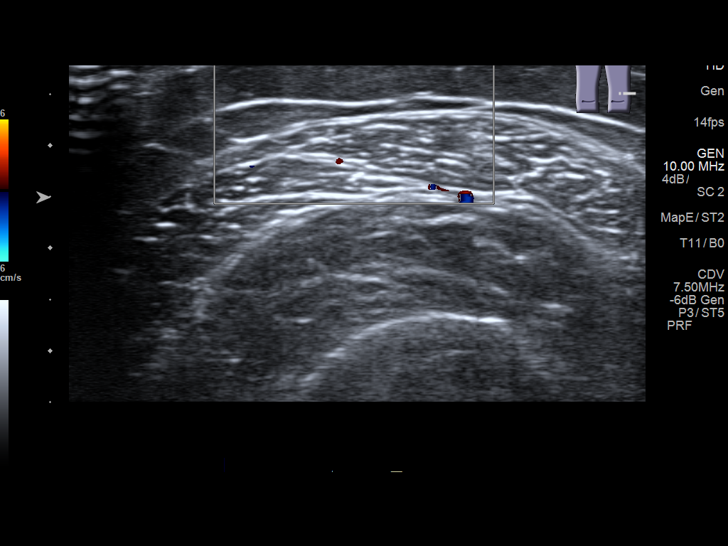
[im 4/18]
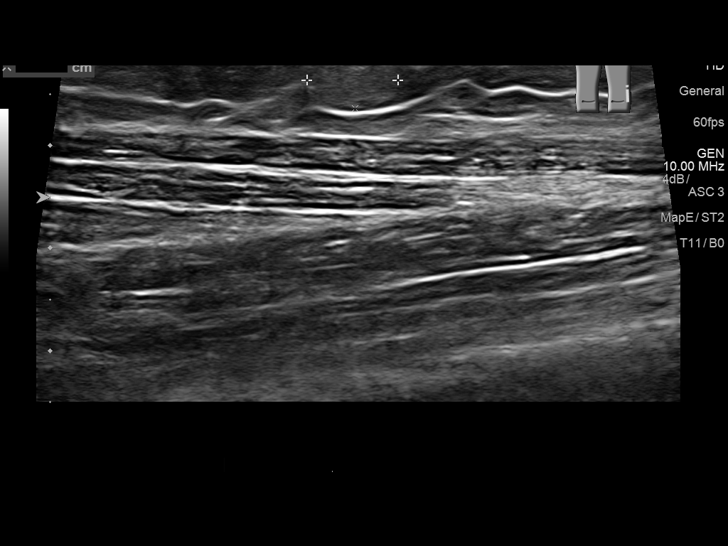
[im 5/18]
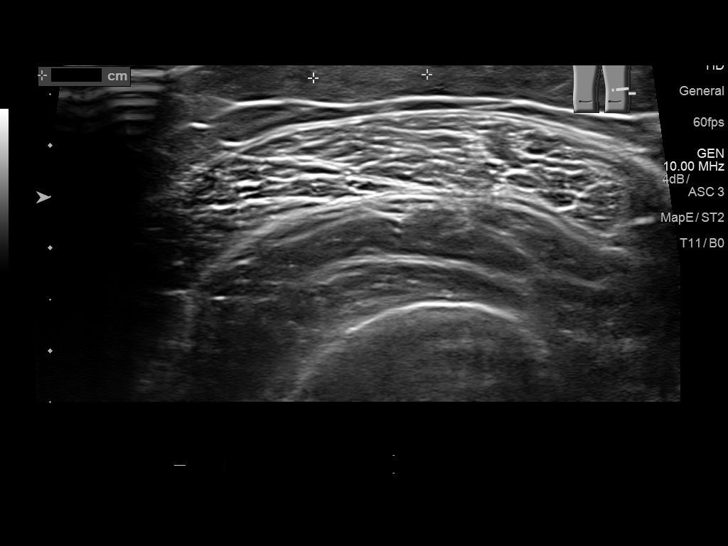
[im 6/18]
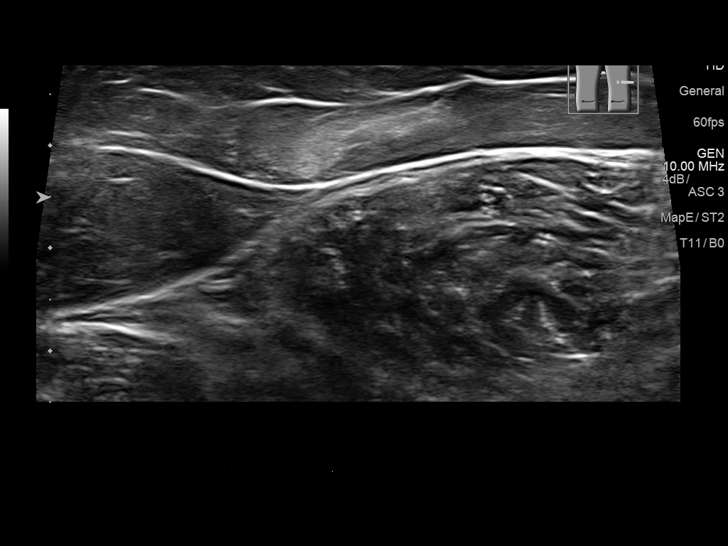
[im 8/18]
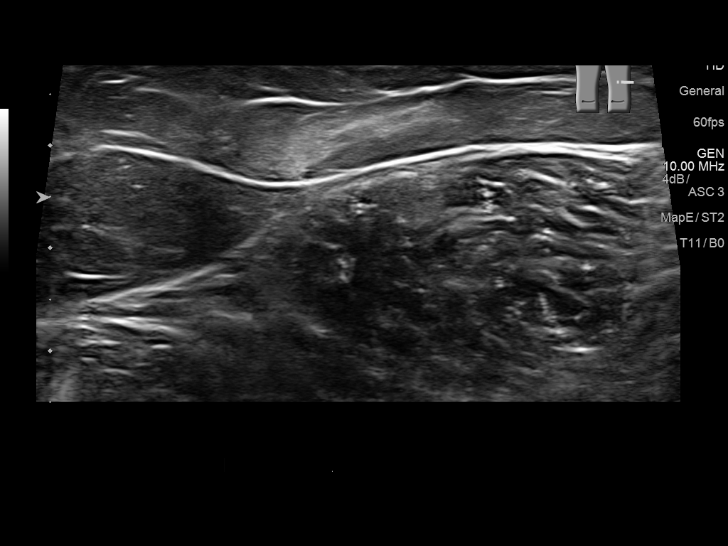
[im 9/18]
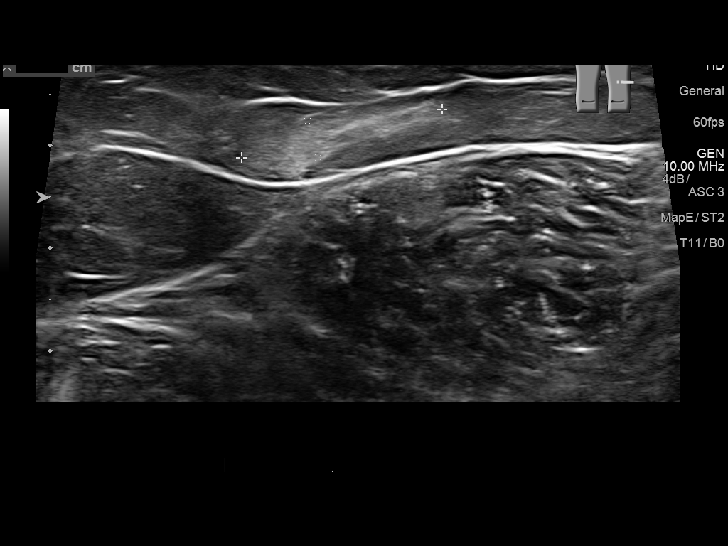
[im 10/18]
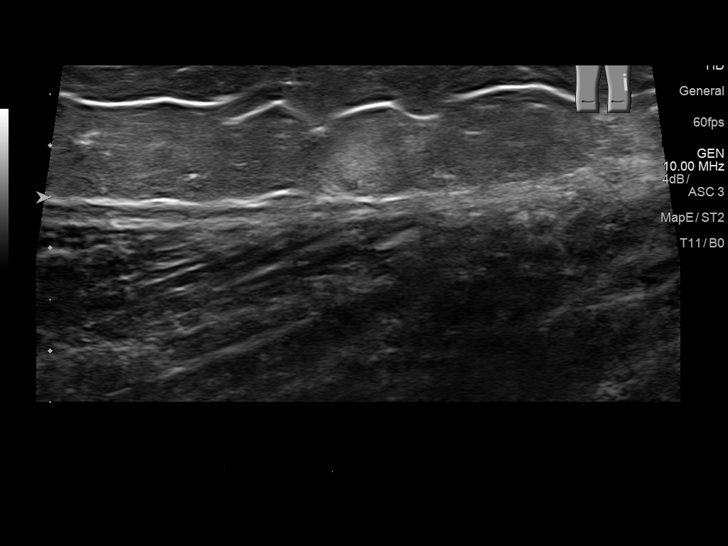
[im 11/18]
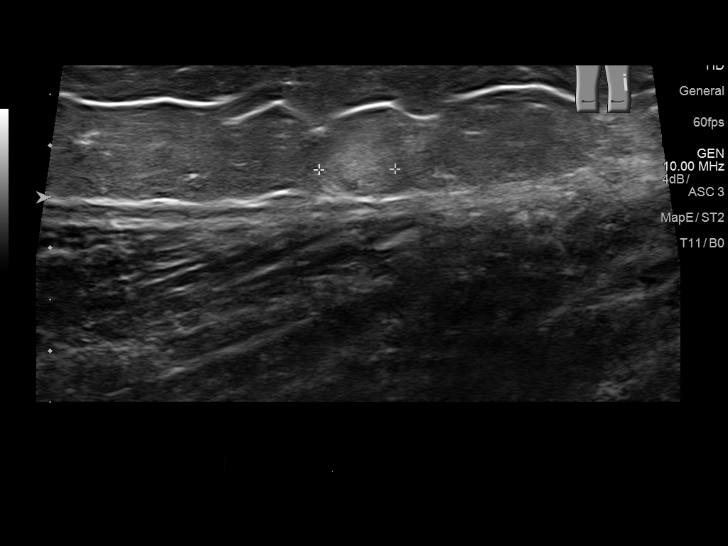
[im 13/18]
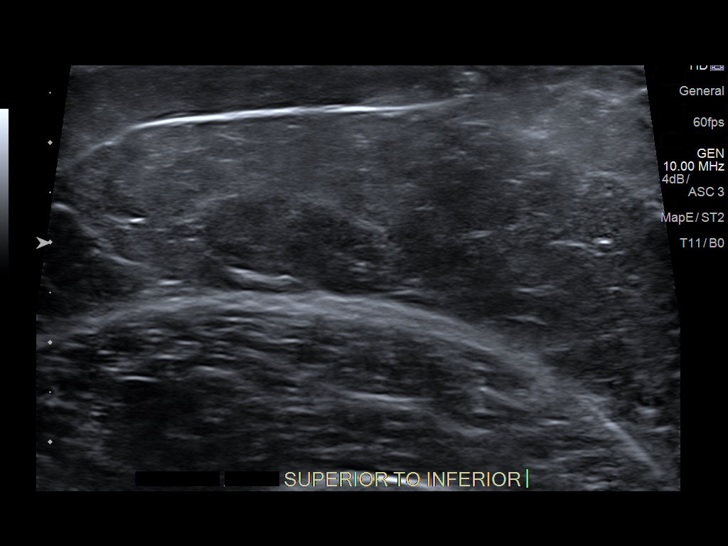
[im 14/18]
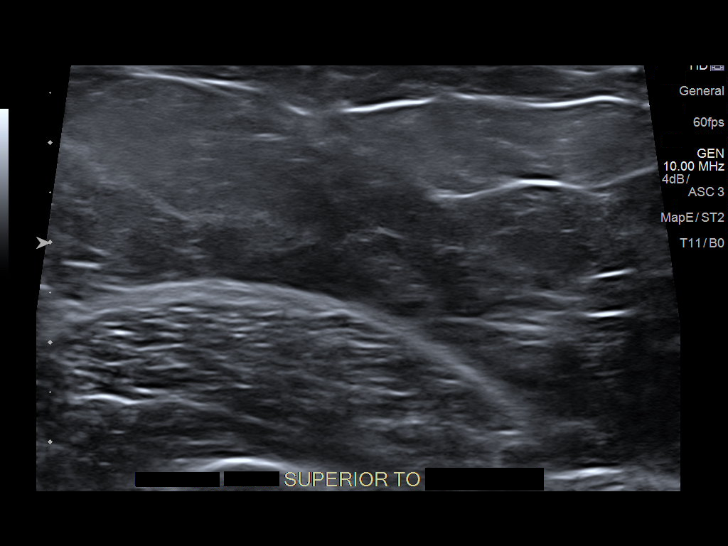
[im 15/18]
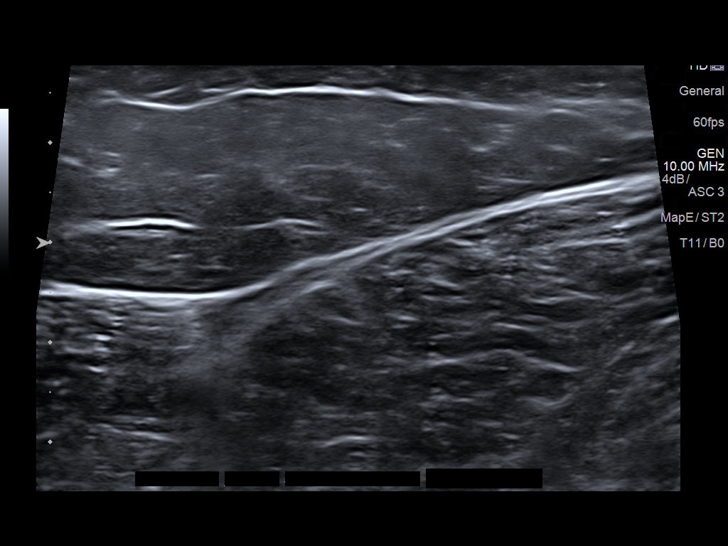
[im 17/18]
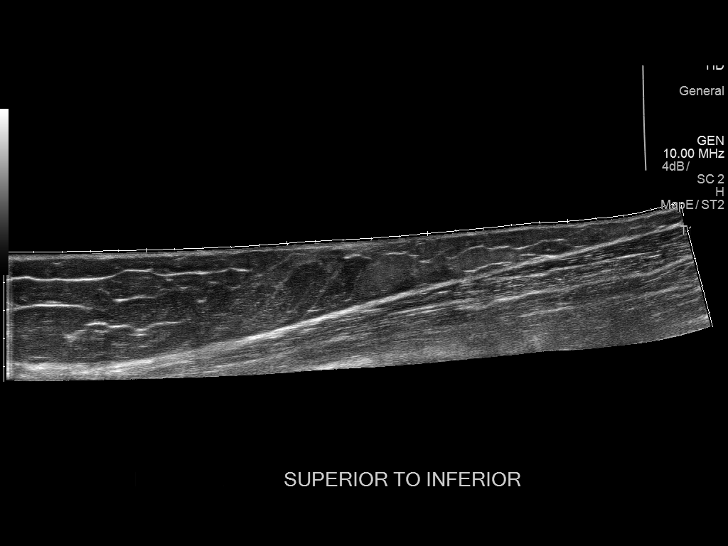
[im 18/18]
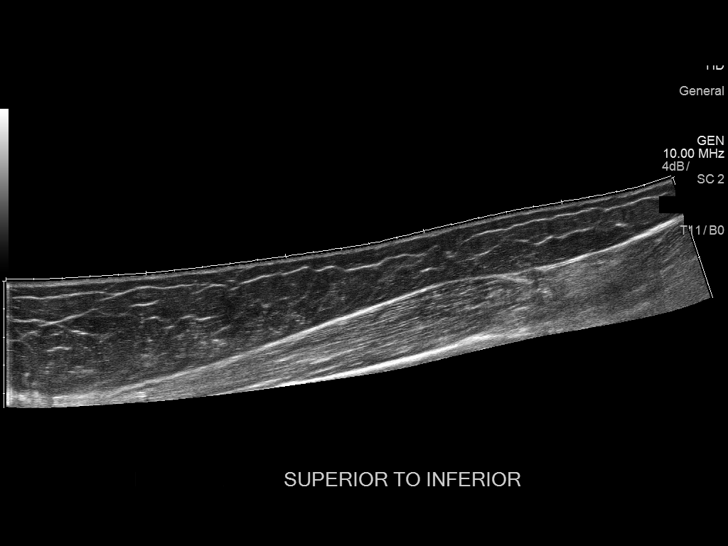

[14 of 18 positions shown; findings below may reference images not displayed]

FINDINGS: The two palpable abnormalities on the left lateral thigh correspond
to oval, well-circumscribed, slightly hyperechoic masses measuring
0.9 x 0.6 x 1.1 cm and 2.0 x 0.4 x 0.7 cm. There is no internal
vascularity.

No fluid collection.
IMPRESSION: Two small masses in the left lateral thigh measuring up to 2.0 cm,
with sonographic characteristics suggestive of lipomas.

## 2020-10-18 ENCOUNTER — Other Ambulatory Visit: Payer: Self-pay

## 2020-10-18 ENCOUNTER — Encounter: Payer: Self-pay | Admitting: Emergency Medicine

## 2020-10-18 ENCOUNTER — Ambulatory Visit
Admission: EM | Admit: 2020-10-18 | Discharge: 2020-10-18 | Disposition: A | Discharge status: Home / Self Care | Payer: BC Managed Care – PPO | Attending: Family Medicine | Admitting: Family Medicine

## 2020-10-18 DIAGNOSIS — R42 Dizziness and giddiness: Secondary | ICD-10-CM

## 2020-10-18 LAB — CBC WITH DIFFERENTIAL/PLATELET
Abs Immature Granulocytes: 0.03 10*3/uL (ref 0.00–0.07)
Basophils Absolute: 0 10*3/uL (ref 0.0–0.1)
Basophils Relative: 0 %
Eosinophils Absolute: 0.1 10*3/uL (ref 0.0–0.5)
Eosinophils Relative: 1 %
HCT: 39.4 % (ref 36.0–46.0)
Hemoglobin: 13.8 g/dL (ref 12.0–15.0)
Immature Granulocytes: 0 %
Lymphocytes Relative: 20 %
Lymphs Abs: 1.7 10*3/uL (ref 0.7–4.0)
MCH: 31.1 pg (ref 26.0–34.0)
MCHC: 35 g/dL (ref 30.0–36.0)
MCV: 88.7 fL (ref 80.0–100.0)
Monocytes Absolute: 0.5 10*3/uL (ref 0.1–1.0)
Monocytes Relative: 6 %
Neutro Abs: 6.1 10*3/uL (ref 1.7–7.7)
Neutrophils Relative %: 73 %
Platelets: 168 10*3/uL (ref 150–400)
RBC: 4.44 MIL/uL (ref 3.87–5.11)
RDW: 11.6 % (ref 11.5–15.5)
WBC: 8.4 10*3/uL (ref 4.0–10.5)
nRBC: 0 % (ref 0.0–0.2)

## 2020-10-18 LAB — COMPREHENSIVE METABOLIC PANEL
ALT: 13 U/L (ref 0–44)
AST: 17 U/L (ref 15–41)
Albumin: 4.1 g/dL (ref 3.5–5.0)
Alkaline Phosphatase: 62 U/L (ref 38–126)
Anion gap: 10 (ref 5–15)
BUN: 5 mg/dL — ABNORMAL LOW (ref 6–20)
CO2: 26 mmol/L (ref 22–32)
Calcium: 9.1 mg/dL (ref 8.9–10.3)
Chloride: 104 mmol/L (ref 98–111)
Creatinine, Ser: 0.68 mg/dL (ref 0.44–1.00)
GFR, Estimated: >60 mL/min (ref >60)
Glucose, Bld: 98 mg/dL (ref 70–99)
Potassium: 3.9 mmol/L (ref 3.5–5.1)
Sodium: 140 mmol/L (ref 135–145)
Total Bilirubin: 0.9 mg/dL (ref 0.3–1.2)
Total Protein: 7.2 g/dL (ref 6.5–8.1)

## 2020-10-18 MED ORDER — MECLIZINE HCL 25 MG PO TABS: 25.0000 mg | ORAL_TABLET | Freq: Three times a day (TID) | ORAL | 0 refills | Status: AC | PRN

## 2020-10-18 NOTE — Discharge Instructions (Signed)
Medication as needed.   I have placed a referral to Cardiology.  Take care  Dr. Lacinda Axon
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
## 2020-10-18 NOTE — ED Triage Notes (Signed)
Pt c/o dizziness, nausea. She states she has been having dizzy spells for about a month after an episode that caused PTSD. She states today she feels like the room is spinning. She states since this started she has been cold.

## 2020-11-03 ENCOUNTER — Ambulatory Visit (INDEPENDENT_AMBULATORY_CARE_PROVIDER_SITE_OTHER): Payer: BC Managed Care – PPO

## 2020-11-03 ENCOUNTER — Encounter: Payer: Self-pay | Admitting: Cardiovascular Disease

## 2020-11-03 ENCOUNTER — Ambulatory Visit: Payer: BC Managed Care – PPO | Admitting: Cardiovascular Disease

## 2020-11-03 ENCOUNTER — Other Ambulatory Visit: Payer: Self-pay

## 2020-11-03 VITALS — BP 125/92 | HR 74 | Ht 67.0 in | Wt 147.0 lb

## 2020-11-03 DIAGNOSIS — R0602 Shortness of breath: Secondary | ICD-10-CM | POA: Diagnosis not present

## 2020-11-03 DIAGNOSIS — R55 Syncope and collapse: Secondary | ICD-10-CM

## 2020-11-03 DIAGNOSIS — R002 Palpitations: Secondary | ICD-10-CM | POA: Diagnosis not present
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
## 2020-11-03 NOTE — Patient Instructions (Signed)
Medication Instructions:  Your physician recommends that you continue on your current medications as directed. Please refer to the Current Medication list given to you today.  *If you need a refill on your cardiac medications before your next appointment, please call your pharmacy*   Lab Work: None ordered If you have labs (blood work) drawn today and your tests are completely normal, you will receive your results only by: [place] Kitchen MyChart Message (if you have MyChart) OR . A paper copy in the mail If you have any lab test that is abnormal or we need to change your treatment, we will call you to review the results.   Testing/Procedures: Your physician has requested that you have an echocardiogram. Echocardiography is a painless test that uses sound waves to create images of your heart. It provides your doctor with information about the size and shape of your heart and how well your heart's chambers and valves are working. This procedure takes approximately one hour. There are no restrictions for this procedure.  Your physician has recommended that you wear a Zio monitor. (To be worn for 14 days) This monitor is a medical device that records the heart's electrical activity. Doctors most often use these monitors to diagnose arrhythmias. Arrhythmias are problems with the speed or rhythm of the heartbeat. The monitor is a small device applied to your chest. You can wear one while you do your normal daily activities. While wearing this monitor if you have any symptoms to push the button and record what you felt. Once you have worn this monitor for the period of time provider prescribed (Usually 14 days), you will return the monitor device in the postage paid box. Once it is returned they will download the data collected and provide [place] with a report which the provider will then review and we will call you with those results. Important tips:  1. Avoid showering during the first 24 hours of wearing the  monitor. 2. Avoid excessive sweating to help maximize wear time. 3. Do not submerge the device, no hot tubs, and no swimming pools. 4. Keep any lotions or oils away from the patch. 5. After 24 hours you may shower with the patch on. Take brief showers with your back facing the shower head.  6. Do not remove patch once it has been placed because that will interrupt data and decrease adhesive wear time. 7. Push the button when you have any symptoms and write down what you were feeling. 8. Once you have completed wearing your monitor, remove and place into box which has postage paid and place in your outgoing mailbox.  9. If for some reason you have misplaced your box then call our office and we can provide another box and/or mail it off for you.         Follow-Up: At [facility], you and your health needs are our priority.  As part of our continuing mission to provide you with exceptional heart care, we have created designated Provider Care Teams.  These Care Teams include your primary Cardiologist (physician) and Advanced Practice Providers (APPs -  Physician Assistants and Nurse Practitioners) who all work together to provide you with the care you need, when you need it.  We recommend signing up for the patient portal called "MyChart".  Sign up information is provided on this After Visit Summary.  MyChart is used to connect with patients for Virtual Visits (Telemedicine).  Patients are able to view lab/test results, encounter notes, upcoming appointments, etc.  Non-urgent messages can be sent to your provider as well.   To learn more about what you can do with MyChart, go to [email or web address].    Your next appointment:   5 week(s)  The format for your next appointment:   In Person  Provider:   You may see  Kathlyn Sacramento, MD or one of the following Advanced Practice Providers on your designated Care Team:    Murray Hodgkins, NP  Christell Faith, PA-C  Marrianne Mood,  PA-C  Cadence Woodruff, [place]  Laurann Montana, NP    Other Instructions N/A

## 2020-11-08 ENCOUNTER — Ambulatory Visit: Payer: BC Managed Care – PPO | Admitting: Cardiology

## 2020-11-25 ENCOUNTER — Other Ambulatory Visit: Payer: Self-pay

## 2020-11-25 ENCOUNTER — Ambulatory Visit (INDEPENDENT_AMBULATORY_CARE_PROVIDER_SITE_OTHER): Payer: BC Managed Care – PPO

## 2020-11-25 DIAGNOSIS — R0602 Shortness of breath: Secondary | ICD-10-CM | POA: Diagnosis not present

## 2020-11-25 LAB — ECHOCARDIOGRAM COMPLETE
AR max vel: 2.27 cm2
AV Area VTI: 2.08 cm2
AV Area mean vel: 2.03 cm2
AV Mean grad: 3 mmHg
AV Peak grad: 6.4 mmHg
Ao pk vel: 1.26 m/s
Area-P 1/2: 3.23 cm2
Calc EF: 59.7 %
S' Lateral: 2.6 cm
Single Plane A2C EF: 55.4 %
Single Plane A4C EF: 62.8 %

## 2020-11-28 ENCOUNTER — Telehealth: Payer: Self-pay | Admitting: Cardiovascular Disease

## 2020-11-28 NOTE — Telephone Encounter (Signed)
   [place] Pre-operative Risk Assessment    Patient Name: Tanya Crawford  DOB: [birth year]  MRN: [id number]   HEARTCARE STAFF: - Please ensure there is not already an duplicate clearance open for this procedure. - Under Visit Info/Reason for Call, type in Other and utilize the format Clearance MM/DD/YY or Clearance TBD. Do not use dashes or single digits. - If request is for dental extraction, please clarify the # of teeth to be extracted.  Request for surgical clearance:  1. What type of surgery is being performed? Dental fillings  2. When is this surgery scheduled? 12/07/20  3. What type of clearance is required (medical clearance vs. Pharmacy clearance to hold med vs. Both)? pharm  4. Are there any medications that need to be held prior to surgery and how long? Advise on pre meds for dental fillings  5. Practice name and name of physician performing surgery? Dr[facility]   6. What is the office phone number?[phone]   7.   What is the office fax number? [phone]  8.   Anesthesia type (None, local, MAC, general) ? local   Tanya Crawford 11/28/2020, 10:12 AM  _________________________________________________________________   (provider comments below)

## 2020-11-28 NOTE — Telephone Encounter (Signed)
No SBE prophylaxis necessary

## 2020-11-28 NOTE — Telephone Encounter (Signed)
Will confirm with our pharmacist, patient had a h/o myocarditis, but no encarditis. Should be ok without SBE prophylaxis.   Otherwise, patient is not on any blood thinner

## 2020-11-29 NOTE — Telephone Encounter (Signed)
    Tanya Crawford DOB:  [birth year]  MRN:  [id number]   Primary Cardiologist: Kathlyn Sacramento, MD  Chart reviewed as part of pre-operative protocol coverage. Given past medical history and time since last visit, based on ACC/AHA guidelines, Tanya Crawford would be at acceptable risk for the planned procedure without further cardiovascular testing.  Recent echocardiogram and a 2-week heart monitor were normal.  She does NOT require SBE prophylaxis.  The patient was advised that if she develops new symptoms prior to surgery to contact our office to arrange for a follow-up visit, and she verbalized understanding.  I will route this recommendation to the requesting party via Epic fax function and remove from pre-op pool.  Please call with questions.  [place], [place] 11/29/2020, 11:35 AM

## 2020-12-09 ENCOUNTER — Ambulatory Visit: Payer: BC Managed Care – PPO | Admitting: Medical

## 2021-06-07 ENCOUNTER — Other Ambulatory Visit: Payer: Self-pay

## 2021-06-07 ENCOUNTER — Ambulatory Visit
Admission: EM | Admit: 2021-06-07 | Discharge: 2021-06-07 | Disposition: A | Discharge status: Home / Self Care | Payer: BC Managed Care – PPO | Attending: Physician Assistant | Admitting: Physician Assistant

## 2021-06-07 DIAGNOSIS — B349 Viral infection, unspecified: Secondary | ICD-10-CM

## 2021-06-07 DIAGNOSIS — Z20828 Contact with and (suspected) exposure to other viral communicable diseases: Secondary | ICD-10-CM

## 2021-06-07 DIAGNOSIS — J029 Acute pharyngitis, unspecified: Secondary | ICD-10-CM | POA: Diagnosis not present

## 2021-06-07 DIAGNOSIS — R110 Nausea: Secondary | ICD-10-CM

## 2021-06-07 DIAGNOSIS — R5383 Other fatigue: Secondary | ICD-10-CM

## 2021-06-07 MED ORDER — ONDANSETRON 4 MG PO TBDP: 4.0000 mg | ORAL_TABLET | Freq: Four times a day (QID) | ORAL | 0 refills | Status: AC | PRN

## 2021-06-07 NOTE — ED Triage Notes (Signed)
Pt here with C/O flu like SX, daughters had flu last week, she states she started feeling bad around Friday. SX-Low grade fever, headache, swollen glands, head congestion, body aches.

## 2021-06-07 NOTE — Discharge Instructions (Addendum)
-  Symptoms consistent with viral illness, likely influenza given your exposure and recent history of COVID (unlikely you'd have COVID again this soon). -Continue with supportive care.  Increase rest and fluids.  Continue the Mucinex all-in-one medication.  I have sent Zofran for the nausea. - If you have continued fevers after the next few days or you develop pain in your chest or breathing difficulty work worsening fatigue you should be seen again.

## 2021-06-07 NOTE — ED Provider Notes (Signed)
[facility]    CSN: [id number] Arrival date & time: 06/07/21  1235      History   Chief Complaint Chief Complaint  Patient presents with   Generalized Body Aches   Sore Throat    HPI Tanya Crawford is a 40 y.o. female presenting for 5 to 6-day history of fatigue, low-grade fever, body aches, nausea without vomiting, sore throat and swollen lymph nodes.  Patient says both of her daughters recently tested positive for influenza last week.  She says she has done COVID test at home which has been negative and does report a personal history of COVID-19 1 month ago.  Patient has been taking Mucinex over-the-counter and staying hydrated.  Patient is most bothered at this time by fatigue and her swollen glands.  She denies any chest pain or breathing difficulty.  Personal history of Sjogren's.  Patient has no other complaints.  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/17/2016    Past Surgical History:  Procedure Laterality Date   ABDOMINAL HYSTERECTOMY     CESAREAN SECTION     CHOLECYSTECTOMY  10/23/2016   Procedure: LAPAROSCOPIC CHOLECYSTECTOMY;  Surgeon: Leonie Green, MD;  Location: [facility] OR[facility];  Service: General;;   LIPOMA EXCISION Left 10/23/2016   Procedure: EXCISION LIPOMA THIGH;  Surgeon: Leonie Green, MD;  Location: [facility];  Service: General;  Laterality: Left;   MASS EXCISION N/A 01/23/2019   Procedure: EXCISION OF LEFT UPPER GUM/CHEEK CYST WITH CLOSURE;   Surgeon: Margaretha Sheffield, MD;  Location: [facility];  Service: ENT;  Laterality: N/A;   TONSILLECTOMY  11/24/2014   [facility], Dr Pryor Ochoa    OB History   No obstetric history on file.      Home Medications    Prior to Admission medications   Medication Sig Start Date End Date Taking? Authorizing Provider  ondansetron (ZOFRAN ODT) 4 MG disintegrating tablet Take 1 tablet (4 mg total) by mouth every 6 (six) hours as needed for nausea or vomiting. 06/07/21  Yes Laurene Footman B, PA-C  celecoxib (CELEBREX) 100 MG capsule Take 100 mg by mouth 2 (two) times daily. 10/06/20   [provider]  diazepam (VALIUM) 10 MG tablet Take 10 mg by mouth daily as needed for anxiety.     [provider]  meclizine (ANTIVERT) 25 MG tablet Take 1 tablet (25 mg total) by mouth 3 (three) times daily as needed for dizziness. 10/18/20   Coral Spikes, DO  oxycodone (OXY-IR) 5 MG capsule Take 5 mg by mouth every 6 (six) hours as needed.    [provider]  DULoxetine (CYMBALTA) 20 MG capsule Take 60 mg by mouth 2 (two) times daily.  07/12/16 10/18/20  [provider]    Family History Family History  Problem Relation Age of  Onset   Cancer Mother    Stroke Mother    Varicose Veins Mother    Breast cancer Mother 69   Cancer Father    Congestive Heart Failure Father    Cancer Other    Breast cancer Other 5       great aunt   Hyperlipidemia Maternal Grandmother    Breast cancer Maternal Grandmother 56   Heart attack Maternal Grandmother    Heart attack Maternal Grandfather    Congestive Heart Failure Paternal Grandfather     Social History Social History   Tobacco Use   Smoking status: Former    Packs/day: 1.00    Years: 20.00    Pack years: 20.00    Types: Cigarettes    Quit date: 06/2020    Years since quitting: 1.0   Smokeless tobacco: Never   Tobacco comments:    since age 5  Vaping Use   Vaping Use: Never used  Substance Use Topics   Alcohol use: No    Drug use: No     Allergies   Adhesive [tape] and Latex   Review of Systems Review of Systems  Constitutional:  Positive for fatigue and fever. Negative for chills and diaphoresis.  HENT:  Positive for congestion and sore throat. Negative for ear pain, rhinorrhea, sinus pressure and sinus pain.   Respiratory:  Negative for cough and shortness of breath.   Gastrointestinal:  Negative for abdominal pain, nausea and vomiting.  Musculoskeletal:  Positive for myalgias. Negative for arthralgias.  Skin:  Negative for rash.  Neurological:  Negative for weakness and headaches.  Hematological:  Positive for adenopathy.    Physical Exam Triage Vital Signs ED Triage Vitals  Enc Vitals Group     BP 06/07/21 1339 126/86     Pulse Rate 06/07/21 1339 65     Resp 06/07/21 1339 18     Temp 06/07/21 1339 98.9 F (37.2 C)     Temp Source 06/07/21 1339 Oral     SpO2 06/07/21 1339 98 %     Weight 06/07/21 1338 145 lb (65.8 kg)     Height 06/07/21 1338 5\' 7"  (1.702 m)     Head Circumference --      Peak Flow --      Pain Score 06/07/21 1338 0     Pain Loc --      Pain Edu? --      Excl. in [place]? --    No data found.  Updated Vital Signs BP 126/86 (BP Location: Left Arm)   Pulse 65   Temp 98.9 F (37.2 C) (Oral)   Resp 18   Ht 5\' 7"  (1.702 m)   Wt 145 lb (65.8 kg)   SpO2 98%   BMI 22.71 kg/m     Physical Exam Vitals and nursing note reviewed.  Constitutional:      General: She is not in acute distress.    Appearance: Normal appearance. She is well-developed. She is ill-appearing. She is not toxic-appearing or diaphoretic.  HENT:     Head: Normocephalic and atraumatic.     Nose: Nose normal.     Mouth/Throat:     Mouth: Mucous membranes are moist.     Pharynx: Oropharynx is clear. Posterior oropharyngeal erythema (mild) present.     Comments: Absent tonsils Eyes:     General: No scleral icterus.       Right eye: No discharge.        Left eye: No discharge.  Conjunctiva/sclera: Conjunctivae normal.  Cardiovascular:     Rate and Rhythm: Normal rate and regular rhythm.     Heart sounds: Normal heart sounds.  Pulmonary:     Effort: Pulmonary effort is normal. No respiratory distress.     Breath sounds: Normal breath sounds.  Musculoskeletal:     Cervical back: Neck supple.  Lymphadenopathy:     Cervical: Cervical adenopathy present.  Skin:    General: Skin is dry.  Neurological:     General: No focal deficit present.     Mental Status: She is alert. Mental status is at baseline.     Motor: No weakness.     Gait: Gait normal.  Psychiatric:        Mood and Affect: Mood normal.        Behavior: Behavior normal.        Thought Content: Thought content normal.     UC Treatments / Results  Labs (all labs ordered are listed, but only abnormal results are displayed) Labs Reviewed - No data to display  EKG   Radiology No results found.  Procedures Procedures (including critical care time)  Medications Ordered in UC Medications - No data to display  Initial Impression / Assessment and Plan / UC Course  I have reviewed the triage vital signs and the nursing notes.  Pertinent labs & imaging results that were available during my care of the patient were reviewed by me and considered in my medical decision making (see chart for details).  40 year old female presenting for 5 to 6-day history of fatigue, fever, body aches, sore throat, nausea and swollen lymph nodes.  Exposure to influenza x2.  Personal history of COVID-19 1 month ago.  Vitals are stable.  Patient is ill-appearing but nontoxic.  She has erythema of posterior pharynx without swelling and absent tonsils.  Enlarged anterior cervical lymph nodes especially on the left side.  Left side is tender.  Chest clear to auscultation heart regular rate and rhythm.  Advised patient she likely has the flu but she is have the window for treatment with Tamiflu and testing not really  indicated or encouraged at this point since it will not change management.  Supportive care encouraged with increasing rest and fluids.  I did offer to send Zofran which she would like.  Advised to continue the Mucinex.  Reviewed return and ED precautions.  Note given for work.  Follow-up as needed.  Final Clinical Impressions(s) / UC Diagnoses   Final diagnoses:  Viral illness  Sore throat  Nausea without vomiting  Other fatigue  Exposure to influenza     Discharge Instructions      -Symptoms consistent with viral illness, likely influenza given your exposure and recent history of COVID (unlikely you'd have COVID again this soon). -Continue with supportive care.  Increase rest and fluids.  Continue the Mucinex all-in-one medication.  I have sent Zofran for the nausea. - If you have continued fevers after the next few days or you develop pain in your chest or breathing difficulty work worsening fatigue you should be seen again.     ED Prescriptions     Medication Sig Dispense Auth. Provider   ondansetron (ZOFRAN ODT) 4 MG disintegrating tablet Take 1 tablet (4 mg total) by mouth every 6 (six) hours as needed for nausea or vomiting. 20 tablet Gretta Cool      PDMP not reviewed this encounter.   Danton Clap, PA-C 06/07/21 1418

## 2021-12-15 ENCOUNTER — Ambulatory Visit
Admission: EM | Admit: 2021-12-15 | Discharge: 2021-12-15 | Disposition: A | Discharge status: Home / Self Care | Payer: BC Managed Care – PPO | Attending: Physician Assistant | Admitting: Physician Assistant

## 2021-12-15 ENCOUNTER — Other Ambulatory Visit: Payer: Self-pay

## 2021-12-15 ENCOUNTER — Encounter: Payer: Self-pay | Admitting: Emergency Medicine

## 2021-12-15 DIAGNOSIS — R591 Generalized enlarged lymph nodes: Secondary | ICD-10-CM | POA: Insufficient documentation

## 2021-12-15 DIAGNOSIS — R5383 Other fatigue: Secondary | ICD-10-CM | POA: Insufficient documentation

## 2021-12-15 LAB — CBC WITH DIFFERENTIAL/PLATELET
Abs Immature Granulocytes: 0.19 10*3/uL — ABNORMAL HIGH (ref 0.00–0.07)
Basophils Absolute: 0.1 10*3/uL (ref 0.0–0.1)
Basophils Relative: 0 %
Eosinophils Absolute: 0.2 10*3/uL (ref 0.0–0.5)
Eosinophils Relative: 1 %
HCT: 39.5 % (ref 36.0–46.0)
Hemoglobin: 13.9 g/dL (ref 12.0–15.0)
Immature Granulocytes: 2 %
Lymphocytes Relative: 25 %
Lymphs Abs: 2.9 10*3/uL (ref 0.7–4.0)
MCH: 32.4 pg (ref 26.0–34.0)
MCHC: 35.2 g/dL (ref 30.0–36.0)
MCV: 92.1 fL (ref 80.0–100.0)
Monocytes Absolute: 0.9 10*3/uL (ref 0.1–1.0)
Monocytes Relative: 7 %
Neutro Abs: 7.3 10*3/uL (ref 1.7–7.7)
Neutrophils Relative %: 65 %
Platelets: 211 10*3/uL (ref 150–400)
RBC: 4.29 MIL/uL (ref 3.87–5.11)
RDW: 11.3 % — ABNORMAL LOW (ref 11.5–15.5)
WBC: 11.4 10*3/uL — ABNORMAL HIGH (ref 4.0–10.5)
nRBC: 0 % (ref 0.0–0.2)

## 2021-12-15 NOTE — ED Provider Notes (Signed)
?[facility] ? ? ? ?CSN: [id number] ?Arrival date & time: 12/15/21  1827 ? ? ?  ? ?History   ?Chief Complaint ?Chief Complaint  ?Patient presents with  ? Jaw Pain  ? ? ?HPI ?Tanya Crawford is a 41 y.o. female presenting for an area of swelling under the right side of her chin for the past 3 weeks.  She says it has become more swollen and is sore to touch.  She says that she has not been sick recently and denies any symptoms at this time if URI conditions.  No cough, congestion, sore throat.  Denies ear pain or dental pain.  No painful swallowing.  No other areas of swelling.  No injury reported.  Patient has history of Sjogren syndrome and says that sometimes she does get randomly swollen lymph nodes but they normally last for a week and go away.  She says this 1 has lasted longer, is more tender and is getting larger.  She does report fatigue.  Has had some weight loss of 2 pant sizes in the past couple months but says that has been purposeful.  Has been eating smaller portion sizes.  Other medical history significant for myocarditis 13 years ago, migraines, neuropathy. ? ?HPI ? ?Past Medical History:  ?Diagnosis Date  ? Anemia   ? H/O   ? Arthritis   ? neck  ? Emphysema of lung ([place])   ? on x-ray (per pt)  ? Headache   ? MIGRAINES-RARE  ? Lower back pain 10/16/2016  ? taking flexeril and hydrocodone  ? 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 h/o years ago (pre  2010)  ? Neuropathy of both feet   ? and lower legs.  from sjogren's (per pt)  ? Sjogren's syndrome ([place])   ? ? ?Patient Active Problem List  ? Diagnosis Date Noted  ? Pain in limb 09/17/2016  ? Mass of left lower leg 09/17/2016  ? Varicose veins of both lower extremities 09/17/2016  ? ? ?Past Surgical History:  ?Procedure Laterality Date  ? ABDOMINAL HYSTERECTOMY    ? CESAREAN SECTION    ? CHOLECYSTECTOMY  10/23/2016  ? Procedure: LAPAROSCOPIC CHOLECYSTECTOMY;  Surgeon: Leonie Green, MD;  Location: [facility];  Service: General;;  ? LIPOMA EXCISION Left  10/23/2016  ? Procedure: EXCISION LIPOMA THIGH;  Surgeon: Leonie Green, MD;  Location: [facility];  Service: General;  Laterality: Left;  ? MASS EXCISION N/A 01/23/2019  ? Procedure: EXCISION OF LEFT UPPER GUM/CHEEK CYST WITH CLOSURE;  Surgeon: Margaretha Sheffield, MD;  Location: [facility];  Service: ENT;  Laterality: N/A;  ? TONSILLECTOMY  11/24/2014  ? [facility], Dr Pryor Ochoa  ? ? ?OB History   ?No obstetric history on file. ?  ? ? ? ?Home Medications   ? ?Prior to Admission medications   ?Medication Sig Start Date End Date Taking? Authorizing Provider  ?diazepam (VALIUM) 10 MG tablet Take 10 mg by mouth daily as needed for anxiety.    Yes [provider]  ?celecoxib (CELEBREX) 100 MG capsule Take 100 mg by mouth 2 (two) times daily. 10/06/20   [provider]  ?meclizine (ANTIVERT) 25 MG tablet Take 1 tablet (25 mg total) by mouth 3 (three) times daily as needed for dizziness. 10/18/20   Coral Spikes, DO  ?ondansetron (ZOFRAN ODT) 4 MG disintegrating tablet Take 1 tablet (4 mg total) by mouth every 6 (six) hours as needed for nausea or vomiting. 06/07/21   Danton Clap, PA-C  ?  oxycodone (OXY-IR) 5 MG capsule Take 5 mg by mouth every 6 (six) hours as needed.    [provider]  ?DULoxetine (CYMBALTA) 20 MG capsule Take 60 mg by mouth 2 (two) times daily.  07/12/16 10/18/20  [provider]  ? ? ?Family History ?Family History  ?Problem Relation Age of Onset  ? Cancer Mother   ? Stroke Mother   ? Varicose Veins Mother   ? Breast cancer Mother 19  ? Cancer Father   ? Congestive Heart Failure Father   ? Cancer Other   ? Breast cancer Other 59  ?     great aunt  ? Hyperlipidemia Maternal Grandmother   ? Breast cancer Maternal Grandmother 60  ? Heart attack Maternal Grandmother   ? Heart attack Maternal Grandfather   ? Congestive Heart Failure Paternal Grandfather   ? ? ?Social History ?Social History  ? ?Tobacco Use  ? Smoking status: Former  ?  Packs/day: 1.00  ?  Years: 20.00  ?   Pack years: 20.00  ?  Types: Cigarettes  ?  Quit date: 06/2020  ?  Years since quitting: 1.5  ? Smokeless tobacco: Never  ? Tobacco comments:  ?  since age 67  ?Vaping Use  ? Vaping Use: Never used  ?Substance Use Topics  ? Alcohol use: No  ? Drug use: No  ? ? ? ?Allergies   ?Adhesive [tape] and Latex ? ? ?Review of Systems ?Review of Systems  ?Constitutional:  Positive for fatigue. Negative for unexpected weight change.  ?HENT:  Negative for congestion, dental problem, ear pain, facial swelling, rhinorrhea and sore throat.   ?Respiratory:  Negative for cough.   ?Cardiovascular:  Negative for chest pain.  ?Neurological:  Negative for dizziness and headaches.  ?Hematological:  Positive for adenopathy.  ? ? ?Physical Exam ?Triage Vital Signs ?ED Triage Vitals  ?Enc Vitals Group  ?   BP 12/15/21 1915 125/78  ?   Pulse Rate 12/15/21 1915 75  ?   Resp 12/15/21 1915 15  ?   Temp 12/15/21 1915 98.4 ?F (36.9 ?C)  ?   Temp Source 12/15/21 1915 Oral  ?   SpO2 12/15/21 1915 100 %  ?   Weight 12/15/21 1912 135 lb (61.2 kg)  ?   Height 12/15/21 1912 '5\' 7"'$  (1.702 m)  ?   Head Circumference --   ?   Peak Flow --   ?   Pain Score 12/15/21 1912 6  ?   Pain Loc --   ?   Pain Edu? --   ?   Excl. in [place]? --   ? ?No data found. ? ?Updated Vital Signs ?BP 125/78 (BP Location: Left Arm) Comment (BP Location): Simultaneous filing. User may not have seen previous data.  Pulse 75   Temp 98.4 ?F (36.9 ?C) (Oral)   Resp 15   Ht '5\' 7"'$  (1.702 m)   Wt 135 lb (61.2 kg)   SpO2 100%   BMI 21.14 kg/m?  ?  ? ?Physical Exam ?Vitals and nursing note reviewed.  ?Constitutional:   ?   General: She is not in acute distress. ?   Appearance: Normal appearance. She is not ill-appearing or toxic-appearing.  ?HENT:  ?   Head: Normocephalic and atraumatic.  ?   Nose: Nose normal.  ?   Mouth/Throat:  ?   Mouth: Mucous membranes are moist.  ?   Dentition: No dental tenderness, gingival swelling or dental abscesses.  ?  Pharynx: Oropharynx is clear.  ?    Comments: Small firm lump right anterior cervical/submandibular region. TTP. ?Eyes:  ?   General: No scleral icterus.    ?   Right eye: No discharge.     ?   Left eye: No discharge.  ?   Conjunctiva/sclera: Conjunctivae normal.  ?Cardiovascular:  ?   Rate and Rhythm: Normal rate and regular rhythm.  ?   Heart sounds: Normal heart sounds.  ?Pulmonary:  ?   Effort: Pulmonary effort is normal. No respiratory distress.  ?   Breath sounds: Normal breath sounds.  ?Musculoskeletal:  ?   Cervical back: Neck supple.  ?Skin: ?   General: Skin is dry.  ?Neurological:  ?   General: No focal deficit present.  ?   Mental Status: She is alert. Mental status is at baseline.  ?   Motor: No weakness.  ?   Gait: Gait normal.  ?Psychiatric:     ?   Mood and Affect: Mood normal.     ?   Behavior: Behavior normal.     ?   Thought Content: Thought content normal.  ? ? ? ?UC Treatments / Results  ?Labs ?(all labs ordered are listed, but only abnormal results are displayed) ?Labs Reviewed  ?CBC WITH DIFFERENTIAL/PLATELET - Abnormal; Notable for the following components:  ?    Result Value  ? WBC 11.4 (*)   ? RDW 11.3 (*)   ? Abs Immature Granulocytes 0.19 (*)   ? All other components within normal limits  ? ? ?EKG ? ? ?Radiology ?No results found. ? ?Procedures ?Procedures (including critical care time) ? ?Medications Ordered in UC ?Medications - No data to display ? ?Initial Impression / Assessment and Plan / UC Course  ?I have reviewed the triage vital signs and the nursing notes. ? ?Pertinent labs & imaging results that were available during my care of the patient were reviewed by me and considered in my medical decision making (see chart for details). ? ?41 year old female presenting for concerns regarding fatigue and area of swelling of the underside of her right chin for the past 3 weeks.  Medical history significant for Sjogren's.  Vitals normal and stable and she is overall well-appearing.  On exam she does have a small firm lump of  the right anterior cervical/submandibular region.  It is tender to palpation.  Otherwise, the exam is unremarkable.  No evidence of dental infection.  No other areas of swelling.  Chest clear to auscultation heart re

## 2021-12-15 NOTE — ED Triage Notes (Signed)
Patient c/o right side jaw pain that started 3 weeks ago.  Patient reports some swelling under her jaw.  Patient denies fevers or chills.  Patient denies sore throat or cold symptoms. ?

## 2021-12-15 NOTE — Discharge Instructions (Addendum)
-  CBC performed today shows only slightly elevated white blood cell count.  This is reassuring but I think given the amount of time you have had this area of swelling it would be best if you had an ultrasound of the area depending on if your PCP agrees.  This may be just an area of lymph node swelling that is not abnormal.  Given that you have history of autoimmune disease, he can have random lymph node swelling.  Make appointment with PCP regarding this. ?- Ice the area to see if that will help and take anti-inflammatory medication to help with pain. ?- Go to emergency department if you develop a fever, sudden worsening of the swelling, weakness, dizziness, feel faint or have any severe acute worsening of any of your symptoms before you can see your PCP. ?

## 2023-06-17 ENCOUNTER — Other Ambulatory Visit: Payer: Self-pay | Admitting: Medical Genetics

## 2023-06-17 DIAGNOSIS — Z006 Encounter for examination for normal comparison and control in clinical research program: Secondary | ICD-10-CM

## 2023-07-19 ENCOUNTER — Other Ambulatory Visit
Admission: RE | Admit: 2023-07-19 | Discharge: 2023-07-19 | Disposition: A | Discharge status: Home / Self Care | Payer: Self-pay | Source: Ambulatory Visit | Attending: Medical Genetics | Admitting: Medical Genetics

## 2023-07-19 DIAGNOSIS — Z006 Encounter for examination for normal comparison and control in clinical research program: Secondary | ICD-10-CM | POA: Insufficient documentation

## 2023-07-30 LAB — GENECONNECT MOLECULAR SCREEN: Genetic Analysis Overall Interpretation: NEGATIVE

## 2023-12-23 ENCOUNTER — Encounter: Payer: Self-pay | Admitting: Physical Medicine & Rehabilitation

## 2024-02-17 ENCOUNTER — Encounter: Admitting: Physical Medicine & Rehabilitation
